# Patient Record
Sex: Male | Born: 1970 | Race: White | Hispanic: No | Marital: Married | Smoking: Current every day smoker
Health system: Southern US, Community
[De-identification: ages and names within clinical notes are randomized; demographics above are authoritative.]

## PROBLEM LIST (undated history)

## (undated) DIAGNOSIS — T1491XA Suicide attempt, initial encounter: Secondary | ICD-10-CM

## (undated) DIAGNOSIS — Z720 Tobacco use: Secondary | ICD-10-CM

## (undated) DIAGNOSIS — I493 Ventricular premature depolarization: Secondary | ICD-10-CM

## (undated) DIAGNOSIS — I10 Essential (primary) hypertension: Secondary | ICD-10-CM

## (undated) DIAGNOSIS — I499 Cardiac arrhythmia, unspecified: Secondary | ICD-10-CM

## (undated) DIAGNOSIS — I498 Other specified cardiac arrhythmias: Secondary | ICD-10-CM

## (undated) DIAGNOSIS — M549 Dorsalgia, unspecified: Secondary | ICD-10-CM

## (undated) DIAGNOSIS — I2510 Atherosclerotic heart disease of native coronary artery without angina pectoris: Secondary | ICD-10-CM

## (undated) HISTORY — DX: Essential (primary) hypertension: I10

## (undated) HISTORY — DX: Ventricular premature depolarization: I49.3

## (undated) HISTORY — DX: Suicide attempt, initial encounter: T14.91XA

---

## 1999-07-08 ENCOUNTER — Encounter: Payer: Self-pay | Admitting: Emergency Medicine

## 1999-07-08 ENCOUNTER — Emergency Department (HOSPITAL_COMMUNITY): Admission: EM | Admit: 1999-07-08 | Discharge: 1999-07-08 | Payer: Self-pay | Admitting: Emergency Medicine

## 1999-07-08 ENCOUNTER — Ambulatory Visit (HOSPITAL_BASED_OUTPATIENT_CLINIC_OR_DEPARTMENT_OTHER): Admission: RE | Admit: 1999-07-08 | Discharge: 1999-07-08 | Payer: Self-pay | Admitting: Orthopedic Surgery

## 2003-09-16 ENCOUNTER — Emergency Department (HOSPITAL_COMMUNITY): Admission: EM | Admit: 2003-09-16 | Discharge: 2003-09-17 | Payer: Self-pay | Admitting: Emergency Medicine

## 2003-09-25 ENCOUNTER — Ambulatory Visit (HOSPITAL_COMMUNITY): Admission: RE | Admit: 2003-09-25 | Discharge: 2003-09-25 | Payer: Self-pay | Admitting: Urology

## 2003-10-07 ENCOUNTER — Ambulatory Visit (HOSPITAL_COMMUNITY): Admission: RE | Admit: 2003-10-07 | Discharge: 2003-10-07 | Payer: Self-pay | Admitting: Urology

## 2003-10-07 ENCOUNTER — Ambulatory Visit (HOSPITAL_BASED_OUTPATIENT_CLINIC_OR_DEPARTMENT_OTHER): Admission: RE | Admit: 2003-10-07 | Discharge: 2003-10-07 | Payer: Self-pay | Admitting: Urology

## 2005-07-01 ENCOUNTER — Emergency Department (HOSPITAL_COMMUNITY): Admission: EM | Admit: 2005-07-01 | Discharge: 2005-07-01 | Payer: Self-pay | Admitting: Emergency Medicine

## 2005-10-23 IMAGING — CT CT PELVIS W/ CM
1 of 5 series · 12 of 32 positions shown, 18 images · IV contrast (omnipaque)
Comparison: none

CLINICAL DATA: Patient with hematuria.
 CT ABDOMEN WITHOUT CONTRAST
 Multiple spiral images were made through the abdomen with oral but not IV contrast.  The lung bases are normal.  There is no bone abnormality.  The liver, spleen, and pancreas are normal.  There are no renal calculi.  
 CT ABDOMEN WITH CONTRAST
 Repeat spiral images were made through the abdomen after intravenous injection of 100 cc Omnipaque 300.  Abdominal organs remain normal, as do the kidneys.  The renal collecting systems and ureters are normal. 
 IMPRESSION
 Normal CT scan of the abdomen without and with contrast.  No calculi or renal abnormality.
 CT PELVIS WITH CONTRAST
 Additional images through the pelvis after oral and intravenous contrast demonstrate no adenopathy or inflammatory change.  At the left base of the bladder, near the ureterovesicle junction, there is a 13 mm filling defect.  The ureter appears to be either part of this filling defect or immediately adjacent to it.  There is no additional abnormality. 
 13 mm filling defect within the left base of the bladder.  This either represents a noncontrast filled ureterocele or a bladder polyp.

[Series 3: abd/pel 5.0 b30f · axial · 0.62mm/px · z∈[-589,-239]mm · 12 of 84 slices shown, 18 images]
[im 7/84  soft-tissue]
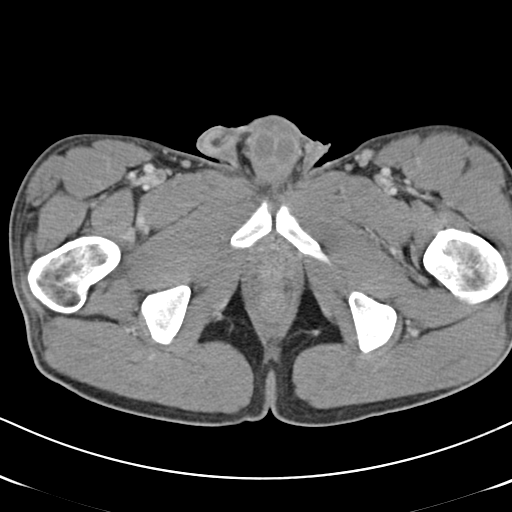
[im 7/84  bone]
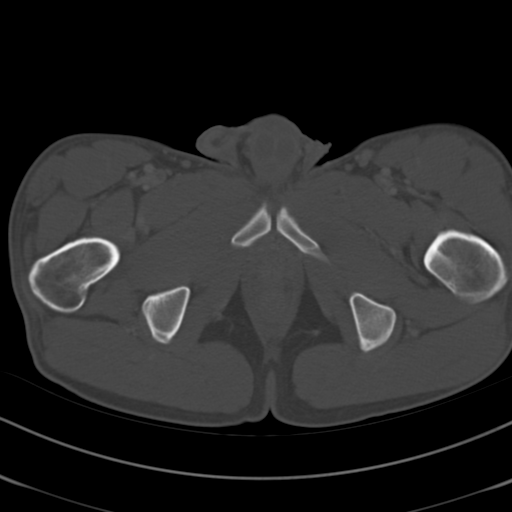
[im 13/84  soft-tissue]
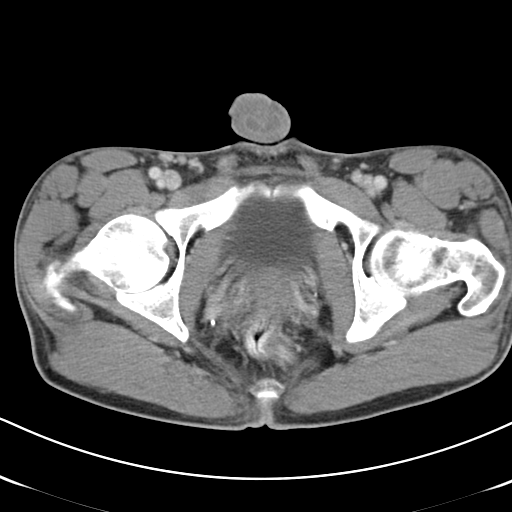
[im 20/84  soft-tissue]
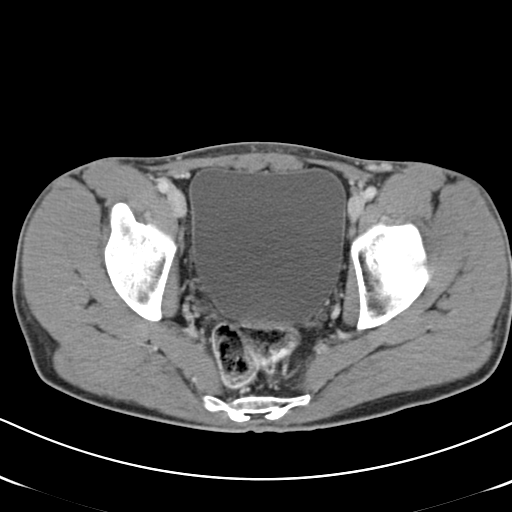
[im 26/84  soft-tissue]
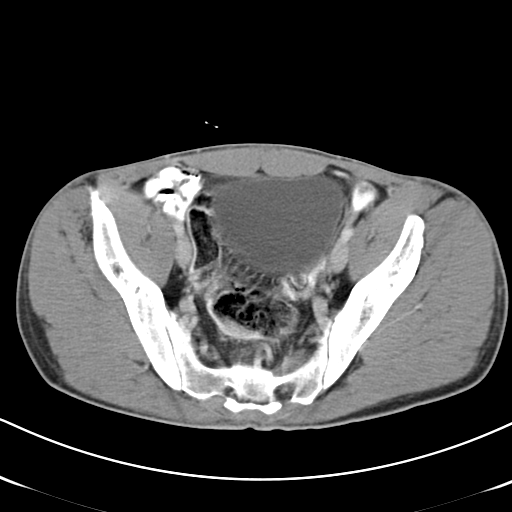
[im 32/84  soft-tissue]
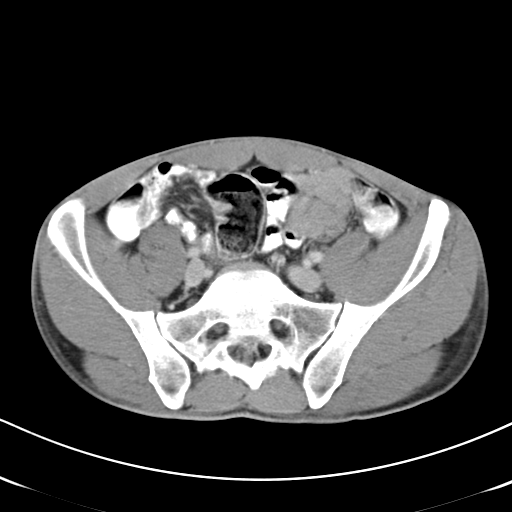
[im 39/84  soft-tissue]
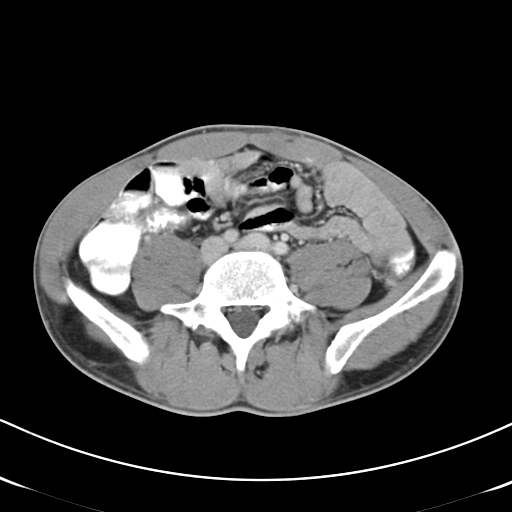
[im 45/84  soft-tissue]
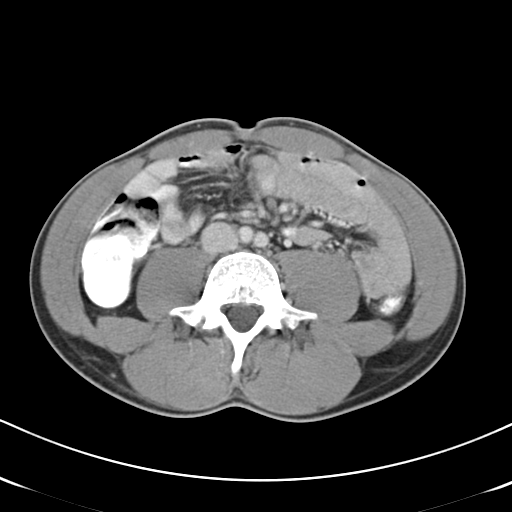
[im 52/84  soft-tissue]
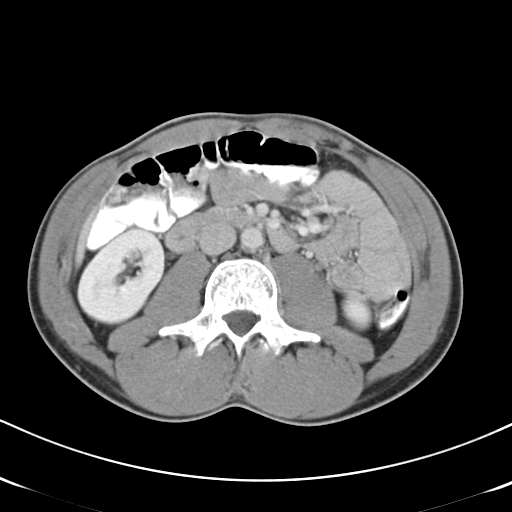
[im 58/84  soft-tissue]
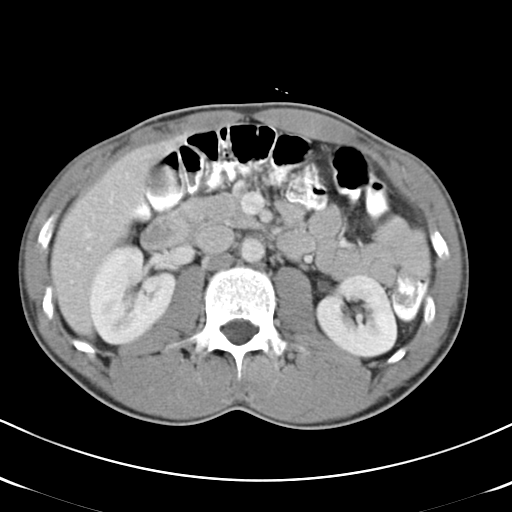
[im 58/84  lung]
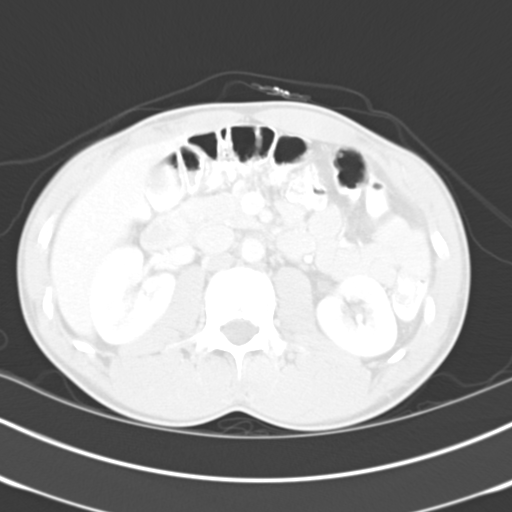
[im 58/84  bone]
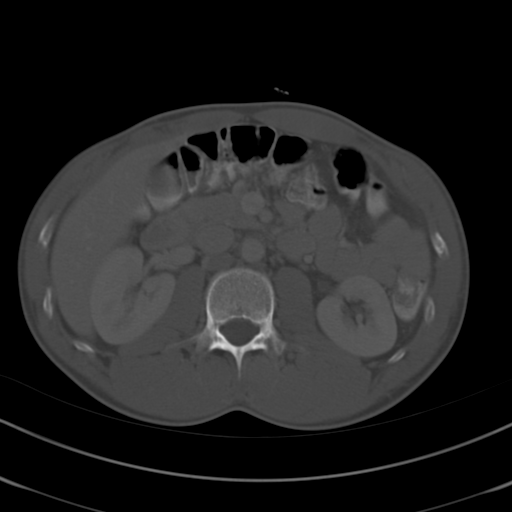
[im 64/84  soft-tissue]
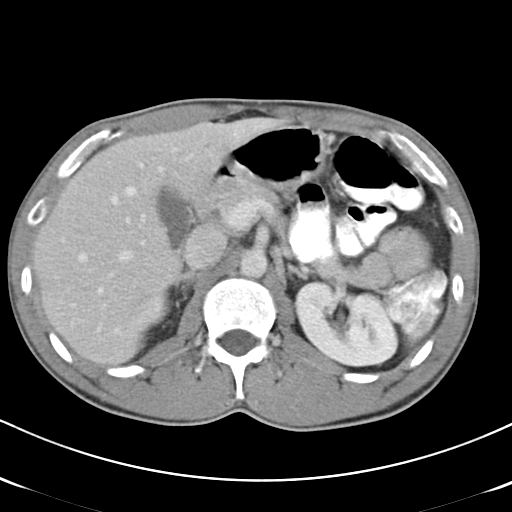
[im 64/84  lung]
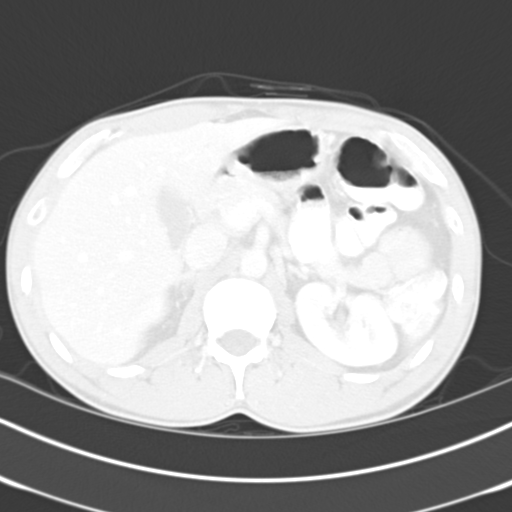
[im 71/84  soft-tissue]
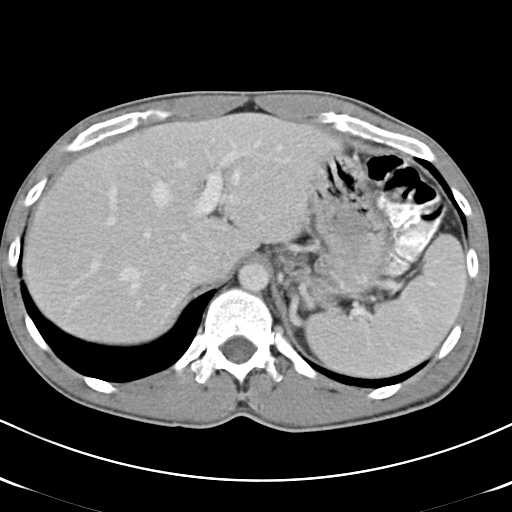
[im 71/84  lung]
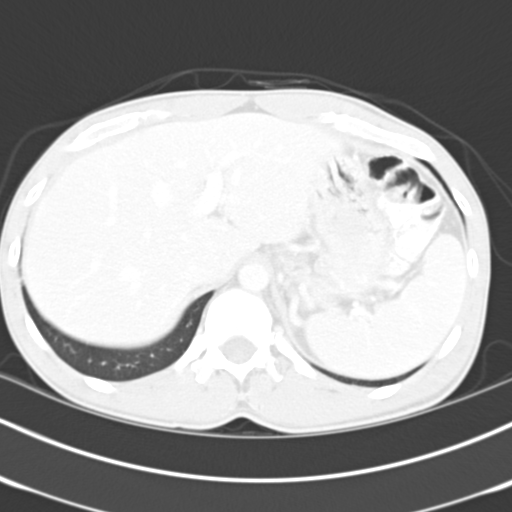
[im 77/84  soft-tissue]
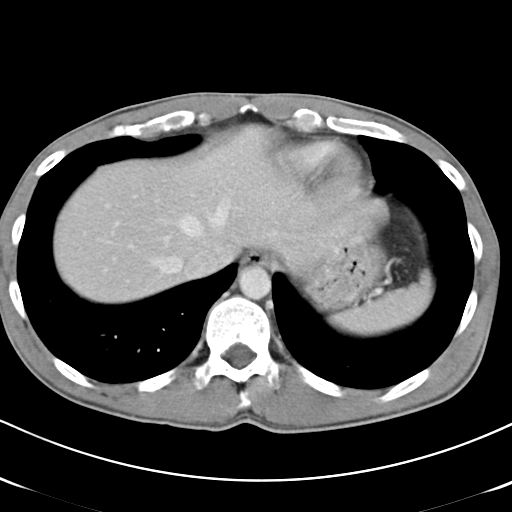
[im 77/84  lung]
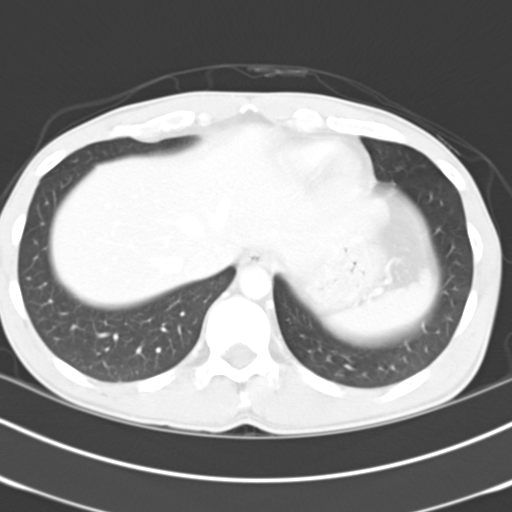

[12 of 32 positions shown; findings below may reference images not displayed]

## 2016-04-28 ENCOUNTER — Other Ambulatory Visit: Payer: Self-pay

## 2016-04-28 ENCOUNTER — Encounter (HOSPITAL_COMMUNITY): Payer: Self-pay | Admitting: Emergency Medicine

## 2016-04-28 ENCOUNTER — Inpatient Hospital Stay (HOSPITAL_COMMUNITY)
Admission: EM | Admit: 2016-04-28 | Discharge: 2016-04-30 | DRG: 247 | Disposition: A | Discharge status: Home / Self Care | Payer: Self-pay | Attending: Cardiovascular Disease | Admitting: Cardiovascular Disease

## 2016-04-28 ENCOUNTER — Emergency Department (HOSPITAL_COMMUNITY): Payer: Self-pay

## 2016-04-28 ENCOUNTER — Encounter (HOSPITAL_COMMUNITY)
Admission: EM | Disposition: A | Discharge status: Home / Self Care | Payer: Self-pay | Source: Home / Self Care | Attending: Cardiovascular Disease

## 2016-04-28 DIAGNOSIS — F17210 Nicotine dependence, cigarettes, uncomplicated: Secondary | ICD-10-CM | POA: Diagnosis present

## 2016-04-28 DIAGNOSIS — I472 Ventricular tachycardia: Secondary | ICD-10-CM | POA: Diagnosis present

## 2016-04-28 DIAGNOSIS — Z8249 Family history of ischemic heart disease and other diseases of the circulatory system: Secondary | ICD-10-CM

## 2016-04-28 DIAGNOSIS — I4729 Other ventricular tachycardia: Secondary | ICD-10-CM | POA: Insufficient documentation

## 2016-04-28 DIAGNOSIS — I214 Non-ST elevation (NSTEMI) myocardial infarction: Principal | ICD-10-CM | POA: Diagnosis present

## 2016-04-28 DIAGNOSIS — I2121 ST elevation (STEMI) myocardial infarction involving left circumflex coronary artery: Secondary | ICD-10-CM

## 2016-04-28 DIAGNOSIS — Z955 Presence of coronary angioplasty implant and graft: Secondary | ICD-10-CM

## 2016-04-28 DIAGNOSIS — I2510 Atherosclerotic heart disease of native coronary artery without angina pectoris: Secondary | ICD-10-CM

## 2016-04-28 DIAGNOSIS — I499 Cardiac arrhythmia, unspecified: Secondary | ICD-10-CM

## 2016-04-28 DIAGNOSIS — Z720 Tobacco use: Secondary | ICD-10-CM

## 2016-04-28 DIAGNOSIS — E785 Hyperlipidemia, unspecified: Secondary | ICD-10-CM | POA: Diagnosis present

## 2016-04-28 DIAGNOSIS — R079 Chest pain, unspecified: Secondary | ICD-10-CM

## 2016-04-28 DIAGNOSIS — I209 Angina pectoris, unspecified: Secondary | ICD-10-CM

## 2016-04-28 HISTORY — DX: Other specified cardiac arrhythmias: I49.8

## 2016-04-28 HISTORY — DX: Cardiac arrhythmia, unspecified: I49.9

## 2016-04-28 HISTORY — PX: CARDIAC CATHETERIZATION: SHX172

## 2016-04-28 HISTORY — DX: Tobacco use: Z72.0

## 2016-04-28 HISTORY — DX: Atherosclerotic heart disease of native coronary artery without angina pectoris: I25.10

## 2016-04-28 LAB — DIFFERENTIAL
Basophils Absolute: 0 10*3/uL (ref 0.0–0.1)
Basophils Relative: 0 %
EOS PCT: 2 %
Eosinophils Absolute: 0.4 10*3/uL (ref 0.0–0.7)
LYMPHS ABS: 1.1 10*3/uL (ref 0.7–4.0)
LYMPHS PCT: 7 %
MONO ABS: 1.3 10*3/uL — AB (ref 0.1–1.0)
Monocytes Relative: 8 %
NEUTROS ABS: 14 10*3/uL — AB (ref 1.7–7.7)
NEUTROS PCT: 83 %

## 2016-04-28 LAB — LIPID PANEL
CHOL/HDL RATIO: 5.1 ratio
CHOLESTEROL: 122 mg/dL (ref 0–200)
HDL: 24 mg/dL — AB (ref >40)
LDL Cholesterol: 74 mg/dL (ref 0–99)
Triglycerides: 119 mg/dL (ref <150)
VLDL: 24 mg/dL (ref 0–40)

## 2016-04-28 LAB — CREATININE, SERUM
Creatinine, Ser: 1.16 mg/dL (ref 0.61–1.24)
GFR calc non Af Amer: >60 mL/min (ref >60)

## 2016-04-28 LAB — BASIC METABOLIC PANEL
Anion gap: 8 (ref 5–15)
BUN: 7 mg/dL (ref 6–20)
CO2: 24 mmol/L (ref 22–32)
CREATININE: 1.21 mg/dL (ref 0.61–1.24)
Calcium: 9.3 mg/dL (ref 8.9–10.3)
Chloride: 106 mmol/L (ref 101–111)
GFR calc Af Amer: >60 mL/min (ref >60)
Glucose, Bld: 123 mg/dL — ABNORMAL HIGH (ref 65–99)
Potassium: 3.9 mmol/L (ref 3.5–5.1)
SODIUM: 138 mmol/L (ref 135–145)

## 2016-04-28 LAB — CBC
HCT: 48.7 % (ref 39.0–52.0)
HEMATOCRIT: 45.3 % (ref 39.0–52.0)
Hemoglobin: 17.2 g/dL — ABNORMAL HIGH (ref 13.0–17.0)
Hemoglobin: 15.6 g/dL (ref 13.0–17.0)
MCH: 34.3 pg — ABNORMAL HIGH (ref 26.0–34.0)
MCH: 33.5 pg (ref 26.0–34.0)
MCHC: 35.3 g/dL (ref 30.0–36.0)
MCHC: 34.4 g/dL (ref 30.0–36.0)
MCV: 97 fL (ref 78.0–100.0)
MCV: 97.2 fL (ref 78.0–100.0)
PLATELETS: 234 10*3/uL (ref 150–400)
Platelets: 209 10*3/uL (ref 150–400)
RBC: 5.02 MIL/uL (ref 4.22–5.81)
RBC: 4.66 MIL/uL (ref 4.22–5.81)
RDW: 12.9 % (ref 11.5–15.5)
RDW: 12.8 % (ref 11.5–15.5)
WBC: 16.4 10*3/uL — AB (ref 4.0–10.5)
WBC: 16.9 10*3/uL — ABNORMAL HIGH (ref 4.0–10.5)

## 2016-04-28 LAB — PROTIME-INR
INR: 2.09 — ABNORMAL HIGH (ref 0.00–1.49)
Prothrombin Time: 23.3 seconds — ABNORMAL HIGH (ref 11.6–15.2)

## 2016-04-28 LAB — APTT: APTT: 153 s — AB (ref 24–37)

## 2016-04-28 LAB — CK TOTAL AND CKMB (NOT AT ARMC)
CK TOTAL: 5172 U/L — AB (ref 49–397)
CK, MB: >260 ng/mL — ABNORMAL HIGH (ref 0.5–5.0)

## 2016-04-28 LAB — MRSA PCR SCREENING: MRSA BY PCR: NEGATIVE

## 2016-04-28 LAB — I-STAT TROPONIN, ED: Troponin i, poc: 0.66 ng/mL (ref 0.00–0.08)

## 2016-04-28 LAB — POCT ACTIVATED CLOTTING TIME: ACTIVATED CLOTTING TIME: 907 s

## 2016-04-28 SURGERY — LEFT HEART CATH AND CORONARY ANGIOGRAPHY

## 2016-04-28 MED ORDER — IOPAMIDOL (ISOVUE-370) INJECTION 76%
INTRAVENOUS | Status: DC | PRN
  Administered 2016-04-28: 115 mL via INTRA_ARTERIAL

## 2016-04-28 MED ORDER — VERAPAMIL HCL 2.5 MG/ML IV SOLN
INTRAVENOUS | Status: DC | PRN
  Administered 2016-04-28: 8 mL via INTRA_ARTERIAL

## 2016-04-28 MED ORDER — LIDOCAINE HCL (PF) 1 % IJ SOLN
INTRAMUSCULAR | Status: AC
  Filled 2016-04-28: qty 30

## 2016-04-28 MED ORDER — FENTANYL CITRATE (PF) 100 MCG/2ML IJ SOLN
INTRAMUSCULAR | Status: DC | PRN
  Administered 2016-04-28: 50 ug via INTRAVENOUS

## 2016-04-28 MED ORDER — HEPARIN (PORCINE) IN NACL 2-0.9 UNIT/ML-% IJ SOLN
INTRAMUSCULAR | Status: DC | PRN
  Administered 2016-04-28: 1000 mL

## 2016-04-28 MED ORDER — SODIUM CHLORIDE 0.9 % IV SOLN
0.1500 mg/kg/h | INTRAVENOUS | Status: DC
  Filled 2016-04-28: qty 250

## 2016-04-28 MED ORDER — SODIUM CHLORIDE 0.9% FLUSH
3.0000 mL | Freq: Two times a day (BID) | INTRAVENOUS | Status: DC
  Administered 2016-04-29 – 2016-04-30 (×3): 3 mL via INTRAVENOUS

## 2016-04-28 MED ORDER — FENTANYL CITRATE (PF) 100 MCG/2ML IJ SOLN
INTRAMUSCULAR | Status: AC
  Filled 2016-04-28: qty 2

## 2016-04-28 MED ORDER — HEPARIN BOLUS VIA INFUSION: 4000.0000 [IU] | Freq: Once | INTRAVENOUS | Status: DC

## 2016-04-28 MED ORDER — TICAGRELOR 90 MG PO TABS
ORAL_TABLET | ORAL | Status: AC
  Filled 2016-04-28: qty 1

## 2016-04-28 MED ORDER — BIVALIRUDIN 250 MG IV SOLR
INTRAVENOUS | Status: AC
  Filled 2016-04-28: qty 250

## 2016-04-28 MED ORDER — NITROGLYCERIN 0.4 MG SL SUBL
0.4000 mg | SUBLINGUAL_TABLET | SUBLINGUAL | Status: DC | PRN
  Administered 2016-04-28: 0.4 mg via SUBLINGUAL
  Filled 2016-04-28: qty 1

## 2016-04-28 MED ORDER — SODIUM CHLORIDE 0.9% FLUSH: 3.0000 mL | INTRAVENOUS | Status: DC | PRN

## 2016-04-28 MED ORDER — ONDANSETRON HCL 4 MG/2ML IJ SOLN: 4.0000 mg | Freq: Four times a day (QID) | INTRAMUSCULAR | Status: DC | PRN

## 2016-04-28 MED ORDER — METOPROLOL TARTRATE 12.5 MG HALF TABLET
12.5000 mg | ORAL_TABLET | Freq: Two times a day (BID) | ORAL | Status: DC
  Administered 2016-04-28 – 2016-04-30 (×4): 12.5 mg via ORAL
  Filled 2016-04-28 (×4): qty 1

## 2016-04-28 MED ORDER — BIVALIRUDIN BOLUS VIA INFUSION - CUPID
INTRAVENOUS | Status: DC | PRN
  Administered 2016-04-28: 56.25 mg via INTRAVENOUS

## 2016-04-28 MED ORDER — SODIUM CHLORIDE 0.9 % IV SOLN: 10.0000 mL/h | INTRAVENOUS | Status: DC

## 2016-04-28 MED ORDER — MIDAZOLAM HCL 2 MG/2ML IJ SOLN
INTRAMUSCULAR | Status: DC | PRN
  Administered 2016-04-28: 2 mg via INTRAVENOUS

## 2016-04-28 MED ORDER — AMIODARONE HCL IN DEXTROSE 360-4.14 MG/200ML-% IV SOLN
30.0000 mg/h | INTRAVENOUS | Status: DC
  Administered 2016-04-29: 30 mg/h via INTRAVENOUS

## 2016-04-28 MED ORDER — PNEUMOCOCCAL VAC POLYVALENT 25 MCG/0.5ML IJ INJ
0.5000 mL | INJECTION | INTRAMUSCULAR | Status: AC
  Administered 2016-04-30: 0.5 mL via INTRAMUSCULAR
  Filled 2016-04-28: qty 0.5

## 2016-04-28 MED ORDER — VERAPAMIL HCL 2.5 MG/ML IV SOLN
INTRAVENOUS | Status: AC
  Filled 2016-04-28: qty 2

## 2016-04-28 MED ORDER — ATORVASTATIN CALCIUM 80 MG PO TABS
80.0000 mg | ORAL_TABLET | Freq: Every day | ORAL | Status: DC
  Administered 2016-04-28 – 2016-04-29 (×2): 80 mg via ORAL
  Filled 2016-04-28 (×2): qty 1

## 2016-04-28 MED ORDER — NITROGLYCERIN 0.4 MG SL SUBL: 0.4000 mg | SUBLINGUAL_TABLET | SUBLINGUAL | Status: DC | PRN

## 2016-04-28 MED ORDER — ASPIRIN EC 81 MG PO TBEC
81.0000 mg | DELAYED_RELEASE_TABLET | Freq: Every day | ORAL | Status: DC
  Administered 2016-04-29 – 2016-04-30 (×2): 81 mg via ORAL
  Filled 2016-04-28 (×2): qty 1

## 2016-04-28 MED ORDER — AMIODARONE HCL IN DEXTROSE 360-4.14 MG/200ML-% IV SOLN
60.0000 mg/h | INTRAVENOUS | Status: AC
  Filled 2016-04-28: qty 200

## 2016-04-28 MED ORDER — HEPARIN SODIUM (PORCINE) 5000 UNIT/ML IJ SOLN: 60.0000 [IU]/kg | INTRAMUSCULAR | Status: DC

## 2016-04-28 MED ORDER — MIDAZOLAM HCL 2 MG/2ML IJ SOLN
INTRAMUSCULAR | Status: AC
  Filled 2016-04-28: qty 2

## 2016-04-28 MED ORDER — SODIUM CHLORIDE 0.9 % IV SOLN
0.2500 mg/kg/h | INTRAVENOUS | Status: AC
  Filled 2016-04-28: qty 250

## 2016-04-28 MED ORDER — AMIODARONE HCL 150 MG/3ML IV SOLN
INTRAVENOUS | Status: AC
  Filled 2016-04-28: qty 3

## 2016-04-28 MED ORDER — SODIUM CHLORIDE 0.9 % IV SOLN: 250.0000 mL | INTRAVENOUS | Status: DC | PRN

## 2016-04-28 MED ORDER — HEPARIN (PORCINE) IN NACL 2-0.9 UNIT/ML-% IJ SOLN
INTRAMUSCULAR | Status: AC
  Filled 2016-04-28: qty 1000

## 2016-04-28 MED ORDER — AMIODARONE HCL IN DEXTROSE 360-4.14 MG/200ML-% IV SOLN
INTRAVENOUS | Status: AC
  Filled 2016-04-28: qty 200

## 2016-04-28 MED ORDER — ASPIRIN 81 MG PO CHEW
324.0000 mg | CHEWABLE_TABLET | Freq: Once | ORAL | Status: AC
  Administered 2016-04-28: 324 mg via ORAL
  Filled 2016-04-28: qty 4

## 2016-04-28 MED ORDER — AMIODARONE HCL IN DEXTROSE 360-4.14 MG/200ML-% IV SOLN
INTRAVENOUS | Status: DC | PRN
  Administered 2016-04-28: 60 mg/h via INTRAVENOUS

## 2016-04-28 MED ORDER — LIDOCAINE HCL (PF) 1 % IJ SOLN
INTRAMUSCULAR | Status: DC | PRN
  Administered 2016-04-28: 2 mL

## 2016-04-28 MED ORDER — TICAGRELOR 90 MG PO TABS
ORAL_TABLET | ORAL | Status: DC | PRN
  Administered 2016-04-28: 180 mg via ORAL

## 2016-04-28 MED ORDER — ASPIRIN 81 MG PO CHEW: 324.0000 mg | CHEWABLE_TABLET | ORAL | Status: DC

## 2016-04-28 MED ORDER — HEPARIN SODIUM (PORCINE) 5000 UNIT/ML IJ SOLN
4000.0000 [IU] | Freq: Once | INTRAMUSCULAR | Status: AC
Start: 2016-04-28 — End: 2016-04-28
  Administered 2016-04-28: 4000 [IU] via INTRAVENOUS

## 2016-04-28 MED ORDER — HEPARIN SODIUM (PORCINE) 5000 UNIT/ML IJ SOLN: 5000.0000 [IU] | Freq: Three times a day (TID) | INTRAMUSCULAR | Status: DC

## 2016-04-28 MED ORDER — AMIODARONE HCL 150 MG/3ML IV SOLN
INTRAVENOUS | Status: DC | PRN
  Administered 2016-04-28: 150 mg via INTRAVENOUS

## 2016-04-28 MED ORDER — ATORVASTATIN CALCIUM 80 MG PO TABS: 80.0000 mg | ORAL_TABLET | Freq: Every day | ORAL | Status: DC

## 2016-04-28 MED ORDER — ACETAMINOPHEN 325 MG PO TABS: 650.0000 mg | ORAL_TABLET | ORAL | Status: DC | PRN

## 2016-04-28 MED ORDER — ASPIRIN 300 MG RE SUPP: 300.0000 mg | RECTAL | Status: DC

## 2016-04-28 MED ORDER — IOPAMIDOL (ISOVUE-370) INJECTION 76%
INTRAVENOUS | Status: AC
  Filled 2016-04-28: qty 125

## 2016-04-28 MED ORDER — HEPARIN SODIUM (PORCINE) 5000 UNIT/ML IJ SOLN: 60.0000 [IU]/kg | Freq: Once | INTRAMUSCULAR | Status: DC

## 2016-04-28 MED ORDER — SODIUM CHLORIDE 0.9 % IV SOLN: INTRAVENOUS | Status: AC

## 2016-04-28 MED ORDER — ASPIRIN EC 81 MG PO TBEC: 81.0000 mg | DELAYED_RELEASE_TABLET | Freq: Every day | ORAL | Status: DC

## 2016-04-28 MED ORDER — TICAGRELOR 90 MG PO TABS
90.0000 mg | ORAL_TABLET | Freq: Two times a day (BID) | ORAL | Status: DC
  Administered 2016-04-29 – 2016-04-30 (×3): 90 mg via ORAL
  Filled 2016-04-28 (×3): qty 1

## 2016-04-28 MED ORDER — SODIUM CHLORIDE 0.9 % IV SOLN
250.0000 mg | INTRAVENOUS | Status: DC | PRN
  Administered 2016-04-28: 1.75 mg/kg/h via INTRAVENOUS

## 2016-04-28 SURGICAL SUPPLY — 18 items
BALLN EUPHORA RX 2.5X12 (BALLOONS) ×3
BALLN ~~LOC~~ EMERGE MR 3.25X12 (BALLOONS) ×3
BALLOON EUPHORA RX 2.5X12 (BALLOONS) IMPLANT
BALLOON ~~LOC~~ EMERGE MR 3.25X12 (BALLOONS) IMPLANT
CATH INFINITI 5FR ANG PIGTAIL (CATHETERS) ×2 IMPLANT
CATH INFINITI JR4 5F (CATHETERS) ×2 IMPLANT
CATH VISTA GUIDE 6FR XBLAD3.5 (CATHETERS) ×2 IMPLANT
DEVICE RAD COMP TR BAND LRG (VASCULAR PRODUCTS) ×2 IMPLANT
GLIDESHEATH SLEND SS 6F .021 (SHEATH) ×2 IMPLANT
KIT ENCORE 26 ADVANTAGE (KITS) ×2 IMPLANT
KIT HEART LEFT (KITS) ×3 IMPLANT
PACK CARDIAC CATHETERIZATION (CUSTOM PROCEDURE TRAY) ×3 IMPLANT
STENT PROMUS PREM MR 3.0X16 (Permanent Stent) ×2 IMPLANT
SYR MEDRAD MARK V 150ML (SYRINGE) ×3 IMPLANT
TRANSDUCER W/STOPCOCK (MISCELLANEOUS) ×3 IMPLANT
TUBING CIL FLEX 10 FLL-RA (TUBING) ×3 IMPLANT
WIRE COUGAR XT STRL 190CM (WIRE) ×2 IMPLANT
WIRE SAFE-T 1.5MM-J .035X260CM (WIRE) ×2 IMPLANT

## 2016-04-28 NOTE — ED Notes (Signed)
Melonie FloridaFiancee' Kimberly  [phone]

## 2016-04-28 NOTE — ED Notes (Signed)
Pt arrives to room, hooked up to monitor immediately and has an episode of what appears to be v-tach. MD called to bedside immediately. 2 IV's placed.

## 2016-04-28 NOTE — ED Notes (Signed)
Pt states at 1pm after eating lunch he had a sudden onset of sharp central chest pain and became diaphoretic. Pt states pain is 10/10 in middle of chest.

## 2016-04-28 NOTE — H&P (Addendum)
Caleb Grant is an 45 y.o. male.   Primary Cardiologist: newAngelena Form PMD:  Chief Complaint: chest pain HPI: 45 year old man with a family history of premature coronary artery disease and a long history of tobacco abuse. He began having chest pressure starting at 1:30 this afternoon. It was constant. He decided to come to the emergency room due to persistent symptoms. While in the emergency room, he had an episode of a rapid, wide complex tachycardia. While in the tachycardia, his chest discomfort increased significantly. It spontaneously broke to normal sinus rhythm in which there were no clear ST segment changes. His chest discomfort persisted. His troponin was positive. We are therefore asked to evaluate urgently.  He continues to have 9 out of 10 chest discomfort. He denies any bleeding problems. Further history reveals that he did have a heart cath about 5 years ago at [facility] which per his report was clean. He continues to smoke. He works delivering cars. He is actually scheduled to drive to [place] tomorrow.  Past Medical History  Diagnosis Date  . Tobacco abuse     History reviewed. No pertinent past surgical history.  Family History  Problem Relation Age of Onset  . Coronary artery disease Father   . Coronary artery disease Paternal Grandfather    Social History:  reports that he has been smoking Cigarettes.  He has been smoking about 2.00 packs per day. He does not have any smokeless tobacco history on file. He reports that he does not drink alcohol. His drug history is not on file.  Allergies: No Known Allergies  No prescriptions prior to admission    Results for orders placed or performed during the hospital encounter of 04/28/16 (from the past 48 hour(s))  Basic metabolic panel     Status: Abnormal   Collection Time: 04/28/16  4:52 PM  Result Value Ref Range   Sodium 138 135 - 145 mmol/L   Potassium 3.9 3.5 - 5.1 mmol/L   Chloride 106 101 - 111 mmol/L   CO2 24 22 - 32 mmol/L   Glucose, Bld 123 (H) 65 - 99 mg/dL   BUN 7 6 - 20 mg/dL   Creatinine, Ser 1.21 0.61 - 1.24 mg/dL   Calcium 9.3 8.9 - 10.3 mg/dL   GFR calc non Af Amer >60 >60 mL/min   GFR calc Af Amer >60 >60 mL/min    Comment: (NOTE) The eGFR has been calculated using the CKD EPI equation. This calculation has not been validated in all clinical situations. eGFR's persistently <60 mL/min signify possible Chronic Kidney Disease.    Anion gap 8 5 - 15  CBC     Status: Abnormal   Collection Time: 04/28/16  4:52 PM  Result Value Ref Range   WBC 16.4 (H) 4.0 - 10.5 K/uL   RBC 5.02 4.22 - 5.81 MIL/uL   Hemoglobin 17.2 (H) 13.0 - 17.0 g/dL   HCT 48.7 39.0 - 52.0 %   MCV 97.0 78.0 - 100.0 fL   MCH 34.3 (H) 26.0 - 34.0 pg   MCHC 35.3 30.0 - 36.0 g/dL   RDW 12.9 11.5 - 15.5 %   Platelets 234 150 - 400 K/uL  I-stat troponin, ED     Status: Abnormal   Collection Time: 04/28/16  5:01 PM  Result Value Ref Range   Troponin i, poc 0.66 (HH) 0.00 - 0.08 ng/mL   Comment NOTIFIED PHYSICIAN    Comment 3  Comment: Due to the release kinetics of cTnI, a negative result within the first hours of the onset of symptoms does not rule out myocardial infarction with certainty. If myocardial infarction is still suspected, repeat the test at appropriate intervals.    Dg Chest Port 1 View  04/28/2016  CLINICAL DATA:  Severe center chest pain x4 hours. Hx of HTN. Smoker. EXAM: PORTABLE CHEST - 1 VIEW COMPARISON:  12/16/2009 FINDINGS: Somewhat prominent perihilar and bibasilar interstitial markings, similar to previous exam. No confluent airspace infiltrate. No overt interstitial edema. Heart size and mediastinal contours are within normal limits. No effusion. Visualized bones unremarkable. IMPRESSION: 1. Chronic perihilar and bibasilar interstitial prominence. No acute disease. Electronically Signed   By: Lucrezia Europe M.D.   On: 04/28/2016 17:48    ROS: Significant for chest discomfort,  worse when he is in a tachycardia. No bleeding problems. He has had hemorrhoids but no internal bleeding. Unable to quit smoking. All the systems negative.  OBJECTIVE:   Vitals:   Filed Vitals:   04/28/16 1708 04/28/16 1730 04/28/16 1755  BP: 169/111 139/93 119/80  Pulse: 80 90   Resp: 16 15   SpO2: 100% 99%    I&O's:  No intake or output data in the 24 hours ending 04/28/16 1813 TELEMETRY: Reviewed telemetry pt in : normal sinus rhythm with intermittent tachycardia    PHYSICAL EXAM General: Well developed, well nourished, in no acute distress Head:   Normal cephalic and atramatic  Lungs:   Clear bilaterally to auscultation. Heart:   HRRR S1 S2  No JVD.   Abdomen: abdomen soft and non-tender Msk:  Back normal,  Normal strength and tone for age. Extremities:   No edema.   2+ right radial pulse Neuro: Alert and oriented. Psych:  Normal affect, responds appropriately  LABS: Basic Metabolic Panel:  Recent Labs  04/28/16 1652  NA 138  K 3.9  CL 106  CO2 24  GLUCOSE 123*  BUN 7  CREATININE 1.21  CALCIUM 9.3   Liver Function Tests: No results for input(s): AST, ALT, ALKPHOS, BILITOT, PROT, ALBUMIN in the last 72 hours. No results for input(s): LIPASE, AMYLASE in the last 72 hours. CBC:  Recent Labs  04/28/16 1652  WBC 16.4*  HGB 17.2*  HCT 48.7  MCV 97.0  PLT 234   Cardiac Enzymes: No results for input(s): CKTOTAL, CKMB, CKMBINDEX, TROPONINI in the last 72 hours. BNP: Invalid input(s): POCBNP D-Dimer: No results for input(s): DDIMER in the last 72 hours. Hemoglobin A1C: No results for input(s): HGBA1C in the last 72 hours. Fasting Lipid Panel: No results for input(s): CHOL, HDL, LDLCALC, TRIG, CHOLHDL, LDLDIRECT in the last 72 hours. Thyroid Function Tests: No results for input(s): TSH, T4TOTAL, T3FREE, THYROIDAB in the last 72 hours.  Invalid input(s): FREET3 Anemia Panel: No results for input(s): VITAMINB12, FOLATE, FERRITIN, TIBC, IRON, RETICCTPCT  in the last 72 hours. Coag Panel:   No results found for: INR, PROTIME     Assessment/Plan Acute coronary syndrome: Elevated troponin with evidence of ventricular tachycardia on telemetry. Persistent pain.  Plan for emergent cardiac catheterization with Dr. Julianne Handler.  Patient should be able to tolerate a drug-eluting stent.  Long-term, he will need a statin, beta blocker and he'll need to stop smoking.  Procedure was explained to the patient and fiancee.  Larae Grooms 04/28/2016, 6:13 PM   Critical care time 40 minutes

## 2016-04-28 NOTE — ED Provider Notes (Signed)
CSN: [id number]     Arrival date & time 04/28/16  1645 History   First MD Initiated Contact with Patient 04/28/16 1725     Chief Complaint  Patient presents with  . Chest Pain   HPI Patient states he developed severe sharp chest pain and became diaphoretic while eating lunch today. Pain is 12 out of 10. Feels sharp and radiates to the right arm. No history of MI. No dyslipidemia hypertension. Denies cocaine use. Did not pass out does not feel lightheaded. No vomiting no abdominal pain no tearing back pain. Does feel some shortness of breath. Patient has not taken anything for his symptoms. Symptoms are severe. Pt has had in past a cath 5 years ago related to CP, states it was negative for CAD.   Past Medical History  Diagnosis Date  . Tobacco abuse    History reviewed. No pertinent past surgical history. Family History  Problem Relation Age of Onset  . Coronary artery disease Father   . Coronary artery disease Paternal Grandfather    Social History  Substance Use Topics  . Smoking status: Current Every Day Smoker -- 2.00 packs/day    Types: Cigarettes  . Smokeless tobacco: None  . Alcohol Use: No    Review of Systems  Constitutional: Negative for fever.  Respiratory: Negative for cough.   Cardiovascular: Positive for palpitations.  Allergic/Immunologic: Negative for immunocompromised state.  All other systems reviewed and are negative.     Allergies  Review of patient's allergies indicates no known allergies.  Home Medications   Prior to Admission medications   Medication Sig Start Date End Date Taking? Authorizing Provider  carvedilol (COREG) 6.25 MG tablet Take 6.25 mg by mouth 2 (two) times daily with a meal.   Yes Historical Provider, MD   BP 169/111 mmHg  Pulse 80  Resp 16  SpO2 100% Physical Exam  Constitutional: He is oriented to person, place, and time. He appears well-developed and well-nourished. He appears distressed.  HENT:  Head: Normocephalic and  atraumatic.  Eyes: Conjunctivae are normal. Right eye exhibits no discharge. Left eye exhibits no discharge.  Neck: Normal range of motion. No JVD present.  Cardiovascular: Normal rate, regular rhythm and normal heart sounds.   No murmur heard. Pulmonary/Chest: Breath sounds normal. No respiratory distress. He has no wheezes. He has no rales. He exhibits no tenderness.  Abdominal: Soft. Bowel sounds are normal. He exhibits no distension.  Musculoskeletal: Normal range of motion. He exhibits no edema (of LEs).  Neurological: He is alert and oriented to person, place, and time.  Skin: Skin is warm. He is not diaphoretic.  Nursing note and vitals reviewed.   ED Course  Procedures (including critical care time) Labs Review Labs Reviewed  BASIC METABOLIC PANEL - Abnormal; Notable for the following:    Glucose, Bld 123 (*)    All other components within normal limits  CBC - Abnormal; Notable for the following:    WBC 16.4 (*)    Hemoglobin 17.2 (*)    MCH 34.3 (*)    All other components within normal limits  PROTIME-INR - Abnormal; Notable for the following:    Prothrombin Time 23.3 (*)    INR 2.09 (*)    All other components within normal limits  DIFFERENTIAL - Abnormal; Notable for the following:    Neutro Abs 14.0 (*)    Monocytes Absolute 1.3 (*)    All other components within normal limits  APTT - Abnormal; Notable for the following:  aPTT 153 (*)    All other components within normal limits  LIPID PANEL - Abnormal; Notable for the following:    HDL 24 (*)    All other components within normal limits  CBC - Abnormal; Notable for the following:    WBC 16.9 (*)    All other components within normal limits  CK TOTAL AND CKMB (NOT AT [facility]) - Abnormal; Notable for the following:    Total CK 5172 (*)    CK, MB >260.0 (*)    All other components within normal limits  I-STAT TROPOININ, ED - Abnormal; Notable for the following:    Troponin i, poc 0.66 (*)    All other  components within normal limits  MRSA PCR SCREENING  CREATININE, SERUM  BASIC METABOLIC PANEL  HEMOGLOBIN A1C  LIPID PANEL  CK TOTAL AND CKMB (NOT AT [facility])  CK TOTAL AND CKMB (NOT AT [facility])  TROPONIN I  TROPONIN I  BASIC METABOLIC PANEL  CBC  LIPID PANEL  POCT ACTIVATED CLOTTING TIME    Imaging Review Dg Chest Port 1 View  04/28/2016  CLINICAL DATA:  Severe center chest pain x4 hours. Hx of HTN. Smoker. EXAM: PORTABLE CHEST - 1 VIEW COMPARISON:  12/16/2009 FINDINGS: Somewhat prominent perihilar and bibasilar interstitial markings, similar to previous exam. No confluent airspace infiltrate. No overt interstitial edema. Heart size and mediastinal contours are within normal limits. No effusion. Visualized bones unremarkable. IMPRESSION: 1. Chronic perihilar and bibasilar interstitial prominence. No acute disease. Electronically Signed   By: Corlis Leak M.D.   On: 04/28/2016 17:48   I have personally reviewed and evaluated these images and lab results as part of my medical decision-making.   EKG Interpretation None      MDM   Final diagnoses:  Cardiac arrhythmia, unspecified cardiac arrhythmia type    Concern for ACS with troponin elevation and EKG consistent with ventricular tachycardia. Immediately spoke with cardiology who is also concerned for ACS and will emergently taken to the cath lab. Heparin bolus, aspirin, nitroglycerin administered. Patient continues hemodynamically stable and now in sinus rhythm. No amiodarone will be given.    Sidney Ace, MD 04/28/16 9147  Azalia Bilis, MD 04/29/16 [phone]

## 2016-04-29 ENCOUNTER — Inpatient Hospital Stay (HOSPITAL_COMMUNITY): Payer: MEDICAID

## 2016-04-29 ENCOUNTER — Encounter (HOSPITAL_COMMUNITY): Payer: Self-pay | Admitting: Cardiovascular Disease

## 2016-04-29 DIAGNOSIS — I2129 ST elevation (STEMI) myocardial infarction involving other sites: Secondary | ICD-10-CM

## 2016-04-29 DIAGNOSIS — I2510 Atherosclerotic heart disease of native coronary artery without angina pectoris: Secondary | ICD-10-CM

## 2016-04-29 DIAGNOSIS — F17200 Nicotine dependence, unspecified, uncomplicated: Secondary | ICD-10-CM

## 2016-04-29 LAB — LIPID PANEL
CHOL/HDL RATIO: 5.6 ratio
CHOLESTEROL: 117 mg/dL (ref 0–200)
HDL: 21 mg/dL — ABNORMAL LOW (ref >40)
LDL Cholesterol: 70 mg/dL (ref 0–99)
TRIGLYCERIDES: 131 mg/dL (ref <150)
VLDL: 26 mg/dL (ref 0–40)

## 2016-04-29 LAB — BASIC METABOLIC PANEL
Anion gap: 8 (ref 5–15)
CALCIUM: 8.8 mg/dL — AB (ref 8.9–10.3)
CO2: 24 mmol/L (ref 22–32)
Chloride: 107 mmol/L (ref 101–111)
Creatinine, Ser: 1.06 mg/dL (ref 0.61–1.24)
GFR calc Af Amer: >60 mL/min (ref >60)
GLUCOSE: 109 mg/dL — AB (ref 65–99)
POTASSIUM: 3.8 mmol/L (ref 3.5–5.1)
SODIUM: 139 mmol/L (ref 135–145)

## 2016-04-29 LAB — ECHOCARDIOGRAM COMPLETE
Height: 66 in
Weight: 2536.17 oz

## 2016-04-29 LAB — CK TOTAL AND CKMB (NOT AT ARMC)
Total CK: 4635 U/L — ABNORMAL HIGH (ref 49–397)
Total CK: 2442 U/L — ABNORMAL HIGH (ref 49–397)

## 2016-04-29 LAB — CBC
HCT: 43.6 % (ref 39.0–52.0)
Hemoglobin: 14.9 g/dL (ref 13.0–17.0)
MCH: 33.3 pg (ref 26.0–34.0)
MCHC: 34.2 g/dL (ref 30.0–36.0)
MCV: 97.5 fL (ref 78.0–100.0)
PLATELETS: 191 10*3/uL (ref 150–400)
RBC: 4.47 MIL/uL (ref 4.22–5.81)
RDW: 13 % (ref 11.5–15.5)
WBC: 11.8 10*3/uL — AB (ref 4.0–10.5)

## 2016-04-29 LAB — HEMOGLOBIN A1C
Hgb A1c MFr Bld: 5.1 % (ref 4.8–5.6)
MEAN PLASMA GLUCOSE: 100 mg/dL

## 2016-04-29 LAB — TROPONIN I: Troponin I: 48.67 ng/mL (ref <0.031)

## 2016-04-29 MED ORDER — NICOTINE 21 MG/24HR TD PT24
21.0000 mg | MEDICATED_PATCH | Freq: Every day | TRANSDERMAL | Status: DC
  Administered 2016-04-29 – 2016-04-30 (×2): 21 mg via TRANSDERMAL
  Filled 2016-04-29 (×2): qty 1

## 2016-04-29 MED ORDER — AMIODARONE HCL IN DEXTROSE 360-4.14 MG/200ML-% IV SOLN
30.0000 mg/h | INTRAVENOUS | Status: AC
  Filled 2016-04-29: qty 200

## 2016-04-29 NOTE — Progress Notes (Signed)
CRITICAL VALUE ALERT  Critical value received:  Troponin > 65  Date of notification:  04/29/2016  Time of notification:  0055  Critical value read back:Yes.    MD notified (1st page): MD Allena KatzPatel paged at [phone].   MD returned call at 0057 and informed of troponin value.

## 2016-04-29 NOTE — Progress Notes (Signed)
*  PRELIMINARY RESULTS* Echocardiogram 2D Echocardiogram has been performed.  Caleb Grant, Caleb Grant 04/29/2016, 11:57 AM

## 2016-04-29 NOTE — Care Management Note (Signed)
Case Management Note  Patient Details  Name: Caleb Grant MRN: [id number] Date of Birth: 02/28/1971  Subjective/Objective:       Adm w stemi             Action/Plan:lives w fam   Expected Discharge Date:                  Expected Discharge Plan:  Home/Self Care  In-House Referral:     Discharge planning Services  CM Consult, Indigent Health Clinic, Medication Assistance  Post Acute Care Choice:    Choice offered to:     DME Arranged:    DME Agency:     HH Arranged:    HH Agency:     Status of Service:     If discussed at [employer] Meetings, dates discussed:    Additional Comments:gave pt 30day free brilinta card. Enc pt to call 800 phone number to get addit 60days. Pt assist form on shadow chart. No ins at present. Pt does have private phy he pays out of pocket for. Gave pt inform on [facility].   Hanley Haysowell, Cordaro Mukai T, RN 04/29/2016, 9:49 AM

## 2016-04-29 NOTE — Progress Notes (Signed)
SUBJECTIVE:  No chest pain.  Some ectopy and nonsustained VTach.  OBJECTIVE:   Vitals:   Filed Vitals:   04/29/16 0700 04/29/16 0800 04/29/16 0850 04/29/16 0905  BP: 112/83 101/88 113/77 102/88  Pulse: 57 64 65 73  Temp:    98.2 F (36.8 C)  TempSrc:    Oral  Resp:      Height:      Weight:      SpO2: 98% 97% 98% 98%   I&O's:   Intake/Output Summary (Last 24 hours) at 04/29/16 1012 Last data filed at 04/29/16 0800  Gross per 24 hour  Intake 1074.31 ml  Output   1125 ml  Net -50.69 ml   TELEMETRY: Reviewed telemetry pt in *NSR, PVCs:     PHYSICAL EXAM General: Well developed, well nourished, in no acute distress Head:   Normal cephalic and atramatic  Lungs:   Clear bilaterally to auscultation. Heart:   HRRR S1 S2  No JVD.   Abdomen: abdomen soft and non-tender Msk:  Back normal,  Normal strength and tone for age. Extremities:   No edema.  3+ right radial pulse Neuro: Alert and oriented. Psych:  Normal affect, responds appropriately Skin: No rash   LABS: Basic Metabolic Panel:  Recent Labs  16/08/9605/21/17 1652 04/28/16 1920 04/29/16 0656  NA 138  --  139  K 3.9  --  3.8  CL 106  --  107  CO2 24  --  24  GLUCOSE 123*  --  109*  BUN 7  --  <5*  CREATININE 1.21 1.16 1.06  CALCIUM 9.3  --  8.8*   Liver Function Tests: No results for input(s): AST, ALT, ALKPHOS, BILITOT, PROT, ALBUMIN in the last 72 hours. No results for input(s): LIPASE, AMYLASE in the last 72 hours. CBC:  Recent Labs  04/28/16 1920 04/29/16 0656  WBC 16.9* 11.8*  NEUTROABS 14.0*  --   HGB 15.6 14.9  HCT 45.3 43.6  MCV 97.2 97.5  PLT 209 191   Cardiac Enzymes:  Recent Labs  04/28/16 1939 04/29/16 04/29/16 0656 04/29/16 0841  CKTOTAL 5172* 4635*  --  2442*  CKMB >260.0* >260.0*  --  >260.0*  TROPONINI  --  >65.00* >65.00*  --    BNP: Invalid input(s): POCBNP D-Dimer: No results for input(s): DDIMER in the last 72 hours. Hemoglobin A1C:  Recent Labs  04/28/16 1920    HGBA1C 5.1   Fasting Lipid Panel:  Recent Labs  04/29/16 0232  CHOL 117  HDL 21*  LDLCALC 70  TRIG [id number]  CHOLHDL 5.6   Thyroid Function Tests: No results for input(s): TSH, T4TOTAL, T3FREE, THYROIDAB in the last 72 hours.  Invalid input(s): FREET3 Anemia Panel: No results for input(s): VITAMINB12, FOLATE, FERRITIN, TIBC, IRON, RETICCTPCT in the last 72 hours. Coag Panel:   Lab Results  Component Value Date   INR 2.09* 04/28/2016    RADIOLOGY: Dg Chest Port 1 View  04/28/2016  CLINICAL DATA:  Severe center chest pain x4 hours. Hx of HTN. Smoker. EXAM: PORTABLE CHEST - 1 VIEW COMPARISON:  12/16/2009 FINDINGS: Somewhat prominent perihilar and bibasilar interstitial markings, similar to previous exam. No confluent airspace infiltrate. No overt interstitial edema. Heart size and mediastinal contours are within normal limits. No effusion. Visualized bones unremarkable. IMPRESSION: 1. Chronic perihilar and bibasilar interstitial prominence. No acute disease. Electronically Signed   By: Corlis Leak  Hassell M.D.   On: 04/28/2016 17:48      ASSESSMENT: Roosvelt Harps/PLAN:    1)  Acute posterolateral wall MI: Occluded OM, treated with DES.  No angina.  Still with some ectopy.  Stop amiodarone at this time.  Watch in CCU for arrhythmia.  2) Nicotine patch.  Stressed smoking cessation.   3) Hyperlipidemia: May be able to decrease Lipitor to 40 mg daily.    4) Change back to carvedilol at discharge.  Stressed importance of RF modification.  Corky CraftsJayadeep S Mitzi Lilja, MD  04/29/2016  10:12 AM

## 2016-04-29 NOTE — Progress Notes (Signed)
CARDIAC REHAB PHASE I   PRE:  Rate/Rhythm: 65 SR c/ PVCs  BP:  Sitting: 113/77        SaO2: 99 RA  MODE:  Ambulation: 350 ft   POST:  Rate/Rhythm: 87 bigeminy  BP:  Sitting: 102/88         SaO2: 98 RA  Pt ambulated 350 ft on RA, IV, handheld assist, steady gait, tolerated well with no complaints. Pt with frequent PVCs/bigeminy, denies any symptoms. Began MI/stent education with pt and fiancee at bedside.  Reviewed risk factors, tobacco cessation (gave pt fake cigarette), MI book, anti-platelet therapy, stent card, activity restrictions, ntg, exercise, and phase 2 cardiac rehab. Left heart healthy diet handouts for pt to review. Pt verbalized understanding, very overwhelmed. Pt agrees to phase 2 cardiac rehab referral, [facility]. Pt does not have insurance, on brilinta, case manager to see prior to discharge. Pt to chair after walk, call bell within reach. Will follow up tomorrow.   [phone] Joylene GrapesEmily C Cris Talavera, RN, BSN 04/29/2016 9:35 AM

## 2016-04-30 ENCOUNTER — Encounter (HOSPITAL_COMMUNITY): Payer: Self-pay | Admitting: Student

## 2016-04-30 ENCOUNTER — Telehealth: Payer: Self-pay | Admitting: Cardiovascular Disease

## 2016-04-30 DIAGNOSIS — I214 Non-ST elevation (NSTEMI) myocardial infarction: Principal | ICD-10-CM

## 2016-04-30 MED ORDER — ATORVASTATIN CALCIUM 80 MG PO TABS: 80.0000 mg | ORAL_TABLET | Freq: Every day | ORAL | Status: DC

## 2016-04-30 MED ORDER — METOPROLOL TARTRATE 25 MG PO TABS: 12.5000 mg | ORAL_TABLET | Freq: Two times a day (BID) | ORAL | Status: DC

## 2016-04-30 MED ORDER — NITROGLYCERIN 0.4 MG SL SUBL
0.4000 mg | SUBLINGUAL_TABLET | SUBLINGUAL | Status: DC | PRN
Start: 2016-04-30 — End: 2017-03-04

## 2016-04-30 MED ORDER — TICAGRELOR 90 MG PO TABS: 90.0000 mg | ORAL_TABLET | Freq: Two times a day (BID) | ORAL | Status: DC

## 2016-04-30 MED ORDER — ASPIRIN 81 MG PO TBEC: 81.0000 mg | DELAYED_RELEASE_TABLET | Freq: Every day | ORAL | Status: AC

## 2016-04-30 NOTE — Telephone Encounter (Signed)
Pt is still currently admitted to the hospital with no discharge summary provided yet. Triage to follow-up with the pt as a TOC call, [facility].

## 2016-04-30 NOTE — Progress Notes (Signed)
CARDIAC REHAB PHASE I   PRE:  Rate/Rhythm: 90 SR bigeminy PVCs  BP:  Supine: 88/60  Sitting: 97/60  Standing:    SaO2: 100%RA  MODE:  Ambulation: 700 ft   POST:  Rate/Rhythm: 104 ST bigeminy PVCs  BP:  Supine:   Sitting: 110/66  Standing:    SaO2: 100%RA 1002-1030 Pt walked 700 ft without dizziness. Gait steady. Rhythm remained with bigeminy PVCs with no runs of VT noted. No CP. To chair after walk. Reviewed heart healthy diet and stressed importance of brilinta with stent.    Luetta Nuttingharlene Savas Elvin, RN BSN  04/30/2016 10:27 AM

## 2016-04-30 NOTE — Progress Notes (Signed)
Gave pt 30day free brilinta card. Gave pt match letter where he can get other meds for low copay 34day supply. He cannot use match for psych or pain meds. Explained must use drug store attached to letter.

## 2016-04-30 NOTE — Telephone Encounter (Signed)
TOC patient-appt 05-07-16 at 830a Sally-900a Katie per WrightBrittainy

## 2016-04-30 NOTE — Discharge Summary (Signed)
Discharge Summary    Patient ID: Caleb Grant,  MRN: [id number], DOB/AGE: 1971-09-26 45 y.o.  Admit date: 04/28/2016 Discharge date: 04/30/2016  Primary Care Provider: No primary care provider on file. Primary Cardiologist: Dr. Angelena Form  Discharge Diagnoses    Principal Problem:   NSTEMI (non-ST elevated myocardial infarction) [facility]) Active Problems:   Tobacco abuse   NSVT (nonsustained ventricular tachycardia) ([place])   History of Present Illness     Caleb Grant is a 45 y.o. male with past medical history of tobacco abuse and family history of premature CAD who presented to [facility] ED on 04/28/2016 for evaluation of chest pain. He reported developing chest pressure earlier that afternoon which radiated into his right arm and was associated with dyspnea. Reported having a cardiac catheterization 5 years ago which did not show CAD.  While in the ED, he had an episode of rapid, wide-complex tachycardia consistent with NSVT. He continued to have 9/10 chest discomfort and was taken emergently for a cardiac catheterization.   Hospital Course     Consultants: None  His catheterization showed 100% stenosis of the 2nd Mrg with mild nonobstructive disease in the LAD and RCA. PCI was performed with a DES and there was a 0% residual stenosis. His EKG did not show ST elevation, therefore this event was classified as an NSTEMI. He was started on ASA and Brilinta along with statin and a BB. He was also started on an Amiodarone drip due to his NSVT.  The following morning, he denied any chest discomfort or dyspnea. He continued to have small episodes of NSVT with Ventricular Bigeminy. His Amiodarone drip was stopped and he was continued on his BB therapy. Labs showed an LDL of 70. Troponin peaked at > 65. An echocardiogram was performed later that day which showed an EF of 50-55% with posterior lateral wall hypokinesis. Mild MR was noted. He ambulated 350 ft with cardiac rehab without  difficulty.   On 04/30/2016, his telemetry continued to show episodes of ventricular bigeminy and frequent PVC's. No prolonged NSVT was noted. His Coreg was switched to Lopressor 12.'5mg'$  BID.  He ambulated another 700 ft without anginal symptoms. No runs of NSVT noted with ambulation, just bigeminy.  He was last examined by Dr. Irish Lack and deemed stable for discharge. Continued smoking cessation was advised. Case Management met with the patient and he was provided with a 30-day card for Brilinta. His patient assistance form for Brilinta was also completed. A 7-day TCM appointment has been arranged.  _____________  Discharge Vitals Blood pressure 90/68, pulse 37, temperature 97.9 F (36.6 C), temperature source Oral, resp. rate 15, height '5\' 6"'$  (1.676 m), weight 158 lb 8.2 oz (71.9 kg), SpO2 98 %.  Filed Weights   04/28/16 1915 04/29/16 0600  Weight: 155 lb 13.8 oz (70.7 kg) 158 lb 8.2 oz (71.9 kg)    Labs & Radiologic Studies     CBC  Recent Labs  04/28/16 1920 04/29/16 0656  WBC 16.9* 11.8*  NEUTROABS 14.0*  --   HGB 15.6 14.9  HCT 45.3 43.6  MCV 97.2 97.5  PLT 209 458   Basic Metabolic Panel  Recent Labs  04/28/16 1652 04/28/16 1920 04/29/16 0656  NA 138  --  139  K 3.9  --  3.8  CL 106  --  107  CO2 24  --  24  GLUCOSE 123*  --  109*  BUN 7  --  <5*  CREATININE 1.21 1.16  1.06  CALCIUM 9.3  --  8.8*   Liver Function Tests No results for input(s): AST, ALT, ALKPHOS, BILITOT, PROT, ALBUMIN in the last 72 hours. No results for input(s): LIPASE, AMYLASE in the last 72 hours. Cardiac Enzymes  Recent Labs  04/28/16 1939 04/29/16 04/29/16 0656 04/29/16 0841 04/29/16 1156  CKTOTAL 5172* 4635*  --  2442*  --   CKMB >260.0* >260.0*  --  >260.0*  --   TROPONINI  --  >65.00* >65.00*  --  48.67*   BNP Invalid input(s): POCBNP D-Dimer No results for input(s): DDIMER in the last 72 hours. Hemoglobin A1C  Recent Labs  04/28/16 1920  HGBA1C 5.1   Fasting Lipid  Panel  Recent Labs  04/29/16 0232  CHOL 117  HDL 21*  LDLCALC 70  TRIG 131  CHOLHDL 5.6    Dg Chest Port 1 View: 04/28/2016  CLINICAL DATA:  Severe center chest pain x4 hours. Hx of HTN. Smoker. EXAM: PORTABLE CHEST - 1 VIEW COMPARISON:  12/16/2009 FINDINGS: Somewhat prominent perihilar and bibasilar interstitial markings, similar to previous exam. No confluent airspace infiltrate. No overt interstitial edema. Heart size and mediastinal contours are within normal limits. No effusion. Visualized bones unremarkable. IMPRESSION: 1. Chronic perihilar and bibasilar interstitial prominence. No acute disease. Electronically Signed   By: Lucrezia Europe M.D.   On: 04/28/2016 17:48    Diagnostic Studies/Procedures     Cardiac Catheterization: 04/28/2016  Prox RCA lesion, 20% stenosed.  Prox LAD to Mid LAD lesion, 20% stenosed.  Mid LAD to Dist LAD lesion, 40% stenosed.  2nd Mrg lesion, 100% stenosed. Post intervention, there is a 0% residual stenosis. The lesion was not previously treated.  The left ventricular systolic function is normal.  1. Acute MI/NSTEMI secondary to occluded large first obtuse marginal branch. (No ST elevation on EKG).  2. Successful PTCA/DES x 1 obtuse marginal branch.  3. Mild non-obstructive disease in the LAD and RCA 4. Overall preserved LV systolic function  Recommendations: Will continue ASA and Brilinta for one year. Will start statin and beta blocker. Continue amiodarone drip tonight.    Echocardiogram: 04/29/2016 Study Conclusions - Left ventricle: Posterior lateral wall hypokinesis The cavity  size was normal. Wall thickness was increased in a pattern of  mild LVH. Systolic function was normal. The estimated ejection  fraction was in the range of 50% to 55%. Left ventricular  diastolic function parameters were normal. - Mitral valve: There was mild regurgitation. - Atrial septum: No defect or patent foramen ovale was identified.    Disposition     Pt is being discharged home today in good condition.  Follow-up Plans & Appointments    Follow-up Information    Follow up with Angelena Form, PA-C On 05/07/2016.   Specialties:  Cardiology, Radiology   Why:  Cardiology Hospital Follow-Up on 05/07/2016. Appointment with Pharmacist at 8:30AM, with Provider at [place] information:   [place] [phone]      Discharge Instructions    Amb Referral to Cardiac Rehabilitation    Complete by:  As directed   Diagnosis:   NSTEMI Coronary Stents             Discharge Medications   Current Discharge Medication List    START taking these medications   Details  aspirin EC 81 MG EC tablet Take 1 tablet (81 mg total) by mouth daily.    atorvastatin (LIPITOR) 80 MG tablet Take 1 tablet (80  mg total) by mouth daily at 6 PM. Qty: 30 tablet, Refills: 6    metoprolol tartrate (LOPRESSOR) 25 MG tablet Take 0.5 tablets (12.5 mg total) by mouth 2 (two) times daily. Qty: 60 tablet, Refills: 6    nitroGLYCERIN (NITROSTAT) 0.4 MG SL tablet Place 1 tablet (0.4 mg total) under the tongue every 5 (five) minutes x 3 doses as needed for chest pain. Qty: 25 tablet, Refills: 3    ticagrelor (BRILINTA) 90 MG TABS tablet Take 1 tablet (90 mg total) by mouth 2 (two) times daily. Qty: 60 tablet, Refills: 11      STOP taking these medications     carvedilol (COREG) 6.25 MG tablet          Aspirin prescribed at discharge?  Yes High Intensity Statin Prescribed? (Lipitor 40-'80mg'$  or Crestor 20-'40mg'$ ): Yes Beta Blocker Prescribed? Yes For EF 40% or less, Was ACEI/ARB Prescribed? No: N/A ADP Receptor Inhibitor Prescribed? (i.e. Plavix etc.-Includes Medically Managed Patients): Yes For EF <40%, Aldosterone Inhibitor Prescribed? No: N/A Was EF assessed during THIS hospitalization? Yes - Cardiac Cath and Echo Was Cardiac Rehab II ordered? (Included Medically managed Patients): Yes    Allergies No Known Allergies   Outstanding Labs/Studies   None  Duration of Discharge Encounter   Greater than 30 minutes including physician time.  Signed, Erma Heritage, PA-C 04/30/2016, 3:03 PM   I have examined the patient and reviewed assessment and plan and discussed with patient.  Agree with above as stated.   1) Acute posterolateral wall MI: Occluded OM, treated with DES. No angina. Still with some ectopy. Stopped amiodarone yesterday; no prolonged NSVT. Will see how he does during the day.  Will change Coreg to metoprolol. BP has been well controlled. Walked with rehab.  No chest pain.  No NSVT.  2) Nicotine patch. Stressed smoking cessation. He feels less cravings.  3) Hyperlipidemia: May be able to decrease Lipitor to 40 mg daily.   Stressed importance of RF modification.   d/ctoday.    Larae Grooms

## 2016-04-30 NOTE — Progress Notes (Signed)
SUBJECTIVE:  No chest pain.  Feels well. Some ectopy and nonsustained VTach. Bigeminy this AM.  OBJECTIVE:   Vitals:   Filed Vitals:   04/30/16 0300 04/30/16 0657 04/30/16 0700 04/30/16 0800  BP: 94/55 93/58 97/66  94/60  Pulse: 36 38 37 43  Temp:      TempSrc:      Resp: 21 20 18 17   Height:      Weight:      SpO2: 99% 99% 98% 100%   I&O's:    Intake/Output Summary (Last 24 hours) at 04/30/16 0941 Last data filed at 04/30/16 0800  Gross per 24 hour  Intake 795.69 ml  Output   2200 ml  Net -1404.31 ml   TELEMETRY: Reviewed telemetry pt in *NSR, PVCs, bigeminy:     PHYSICAL EXAM General: Well developed, well nourished, in no acute distress Head:   Normal cephalic and atramatic  Lungs:   Clear bilaterally to auscultation. Heart:   HRRR S1 S2  No JVD.   Abdomen: abdomen soft and non-tender Msk:  Back normal,  Normal strength and tone for age. Extremities:   No edema.  3+ right radial pulse Neuro: Alert and oriented. Psych:  Normal affect, responds appropriately Skin: No rash   LABS: Basic Metabolic Panel:  Recent Labs  16/08/9605/21/17 1652 04/28/16 1920 04/29/16 0656  NA 138  --  139  K 3.9  --  3.8  CL 106  --  107  CO2 24  --  24  GLUCOSE 123*  --  109*  BUN 7  --  <5*  CREATININE 1.21 1.16 1.06  CALCIUM 9.3  --  8.8*   Liver Function Tests: No results for input(s): AST, ALT, ALKPHOS, BILITOT, PROT, ALBUMIN in the last 72 hours. No results for input(s): LIPASE, AMYLASE in the last 72 hours. CBC:  Recent Labs  04/28/16 1920 04/29/16 0656  WBC 16.9* 11.8*  NEUTROABS 14.0*  --   HGB 15.6 14.9  HCT 45.3 43.6  MCV 97.2 97.5  PLT 209 191   Cardiac Enzymes:  Recent Labs  04/28/16 1939 04/29/16 04/29/16 0656 04/29/16 0841 04/29/16 1156  CKTOTAL 5172* 4635*  --  2442*  --   CKMB >260.0* >260.0*  --  >260.0*  --   TROPONINI  --  >65.00* >65.00*  --  48.67*   BNP: Invalid input(s): POCBNP D-Dimer: No results for input(s): DDIMER in the last 72  hours. Hemoglobin A1C:  Recent Labs  04/28/16 1920  HGBA1C 5.1   Fasting Lipid Panel:  Recent Labs  04/29/16 0232  CHOL 117  HDL 21*  LDLCALC 70  TRIG [id number]  CHOLHDL 5.6   Thyroid Function Tests: No results for input(s): TSH, T4TOTAL, T3FREE, THYROIDAB in the last 72 hours.  Invalid input(s): FREET3 Anemia Panel: No results for input(s): VITAMINB12, FOLATE, FERRITIN, TIBC, IRON, RETICCTPCT in the last 72 hours. Coag Panel:   Lab Results  Component Value Date   INR 2.09* 04/28/2016    RADIOLOGY: Dg Chest Port 1 View  04/28/2016  CLINICAL DATA:  Severe center chest pain x4 hours. Hx of HTN. Smoker. EXAM: PORTABLE CHEST - 1 VIEW COMPARISON:  12/16/2009 FINDINGS: Somewhat prominent perihilar and bibasilar interstitial markings, similar to previous exam. No confluent airspace infiltrate. No overt interstitial edema. Heart size and mediastinal contours are within normal limits. No effusion. Visualized bones unremarkable. IMPRESSION: 1. Chronic perihilar and bibasilar interstitial prominence. No acute disease. Electronically Signed   By: Corlis Leak  Hassell M.D.   On: 04/28/2016  17:48      ASSESSMENT: Roosvelt Harps/PLAN:    1) Acute posterolateral wall MI: Occluded OM, treated with DES.  No angina.  Still with some ectopy.  Stopped amiodarone yesterday; no prolonged NSVT.  Will see how he does during the day.  COuld consider discharge later today.  Will change Coreg to metoprolol.  BO has been well controlled  2) Nicotine patch.  Stressed smoking cessation.  He feels less cravings.  3) Hyperlipidemia: May be able to decrease Lipitor to 40 mg daily.    Stressed importance of RF modification.  Possible d/c later today.  Otherwise tomorrow.    Corky CraftsJayadeep S Neena Beecham, MD  04/30/2016  9:41 AM

## 2016-05-03 NOTE — Telephone Encounter (Signed)
Patient contacted regarding discharge from [facility] on April 30, 2016.  Patient understands to follow up with provider Bary CastillaKaty Thompson, PA-C on May 03, 2016 at [facility] at [facility]. Patient understands discharge instructions? yes Patient understands medications and regiment? yes Patient understands to bring all medications to this visit? yes

## 2016-05-06 NOTE — Progress Notes (Signed)
Cardiology Office Note    Date:  05/07/2016   ID:  Caleb Grant, DOB 11-24-1970, MRN [id number]  PCP:  No primary care provider on file.  Cardiologist:  Dr. Clifton Jaxtin  CC: post hospital follow up  History of Present Illness:  Caleb Grant is a 45 y.o. male with a history of tobacco abuse, family hx of premature CAD and recently diagnosed CAD: NSTEMI s/p DES to 2nd Mrg, mild MR and NSVT who presents to clinic for post hospital follow up.   He was recently admitted to [facility] from 6/21-6/23/17. He presented with chest pain.  POC troponin was 0.66. While in the ED, he had an episode of rapid, wide-complex tachycardia consistent with NSVT. He continued to have 9/10 chest discomfort and was taken emergently for a cardiac catheterization. His catheterization showed 100% stenosis of the 2nd Mrg with mild nonobstructive disease in the LAD and RCA. PCI was performed with a DES and there was a 0% residual stenosis. His EKG did not show ST elevation, therefore this event was classified as an NSTEMI. He was started on ASA and Brilinta along with statin and a BB. He was also started on an Amiodarone drip due to his NSVT. The following morning, he denied any chest discomfort or dyspnea. He continued to have small episodes of NSVT with Ventricular Bigeminy. His Amiodarone drip was stopped and he was continued on his BB therapy. Labs showed an LDL of 70. Troponin peaked at > 65. An echocardiogram was performed later that day which showed an EF of 50-55% with posterior lateral wall hypokinesis. Mild MR was noted.   Today he presents to clinic for follow up. He has had no chest pain or SOB. He has been walking 2x day for 10 minutes with no chest pain or SOB. No palpitations. No LE edema, orthopnea or PND. No dizziness or syncope. Still smoking 3/4 PPD. Wants to go back to go back to work, he is a [employer] architect.      Past Medical History  Diagnosis Date  . Tobacco abuse   . CAD (coronary artery disease)     a.  04/2016: NSTEMI and NSVT: Cath showing 100% stenosis 2nd Mrg (PCI w/ DES), 20% stenosis mid-LAD, 40% distal LAD, and 20% proxRCA   . Ventricular bigeminy     a. noted during 04/2016 admission. Started on BB.    Past Surgical History  Procedure Laterality Date  . Cardiac catheterization N/A 04/28/2016    Procedure: Left Heart Cath and Coronary Angiography;  Surgeon: Kathleene Hazel, MD;  Location: [facility] INVASIVE CV LAB;  Service: Cardiovascular;  Laterality: N/A;  . Cardiac catheterization N/A 04/28/2016    Procedure: Coronary Stent Intervention;  Surgeon: Kathleene Hazel, MD;  Location: [facility] INVASIVE CV LAB;  Service: Cardiovascular;  Laterality: N/A;    Current Medications: Outpatient Prescriptions Prior to Visit  Medication Sig Dispense Refill  . aspirin EC 81 MG EC tablet Take 1 tablet (81 mg total) by mouth daily.    Marland Kitchen atorvastatin (LIPITOR) 80 MG tablet Take 1 tablet (80 mg total) by mouth daily at 6 PM. 30 tablet 6  . metoprolol tartrate (LOPRESSOR) 25 MG tablet Take 0.5 tablets (12.5 mg total) by mouth 2 (two) times daily. 60 tablet 6  . nitroGLYCERIN (NITROSTAT) 0.4 MG SL tablet Place 1 tablet (0.4 mg total) under the tongue every 5 (five) minutes x 3 doses as needed for chest pain. 25 tablet 3  . ticagrelor (BRILINTA) 90 MG TABS  tablet Take 1 tablet (90 mg total) by mouth 2 (two) times daily. 60 tablet 11   No facility-administered medications prior to visit.     Allergies:   Review of patient's allergies indicates no known allergies.   Social History   Social History  . Marital Status: Married    Spouse Name: N/A  . Number of Children: N/A  . Years of Education: N/A   Social History Main Topics  . Smoking status: Current Every Day Smoker -- 2.00 packs/day    Types: Cigarettes  . Smokeless tobacco: None  . Alcohol Use: No  . Drug Use: None  . Sexual Activity: Not Asked   Other Topics Concern  . None   Social History Narrative     Family History:  The  patient's family history includes Coronary artery disease in his father and paternal grandfather; Heart disease in his sister; Heart failure in his sister.     ROS:   Please see the history of present illness.    ROS All other systems reviewed and are negative.   PHYSICAL EXAM:   VS:  BP 118/84 mmHg  Pulse 78  Ht 5\' 7"  (1.702 m)  Wt 157 lb (71.215 kg)  BMI 24.58 kg/m2   GEN: Well nourished, well developed, in no acute distress HEENT: normal Neck: no JVD, carotid bruits, or masses Cardiac: RRR; no murmurs, rubs, or gallops,no edema  Respiratory:  clear to auscultation bilaterally, normal work of breathing GI: soft, nontender, nondistended, + BS MS: no deformity or atrophy Skin: warm and dry, no rash Neuro:  Alert and Oriented x 3, Strength and sensation are intact Psych: euthymic mood, full affect  Wt Readings from Last 3 Encounters:  05/07/16 157 lb (71.215 kg)  04/29/16 158 lb 8.2 oz (71.9 kg)      Studies/Labs Reviewed:   EKG:  EKG is ordered today.  The ekg ordered today demonstrates NSR HR 78. Non specific ST/TW abnormality in lateral leads.   Recent Labs: 04/29/2016: BUN <5*; Creatinine, Ser 1.06; Hemoglobin 14.9; Platelets 191; Potassium 3.8; Sodium 139   Lipid Panel    Component Value Date/Time   CHOL 117 04/29/2016 0232   TRIG 131 04/29/2016 0232   HDL 21* 04/29/2016 0232   CHOLHDL 5.6 04/29/2016 0232   VLDL 26 04/29/2016 0232   LDLCALC 70 04/29/2016 0232    Additional studies/ records that were reviewed today include:  Cardiac Catheterization: 04/28/2016  Prox RCA lesion, 20% stenosed.  Prox LAD to Mid LAD lesion, 20% stenosed.  Mid LAD to Dist LAD lesion, 40% stenosed.  2nd Mrg lesion, 100% stenosed. Post intervention, there is a 0% residual stenosis. The lesion was not previously treated.  The left ventricular systolic function is normal.  1. Acute MI/NSTEMI secondary to occluded large first obtuse marginal branch. (No ST elevation on EKG).  2.  Successful PTCA/DES x 1 obtuse marginal branch.  3. Mild non-obstructive disease in the LAD and RCA 4. Overall preserved LV systolic function  Recommendations: Will continue ASA and Brilinta for one year. Will start statin and beta blocker. Continue amiodarone drip tonight.    Echocardiogram: 04/29/2016 Study Conclusions - Left ventricle: Posterior lateral wall hypokinesis The cavity  size was normal. Wall thickness was increased in a pattern of  mild LVH. Systolic function was normal. The estimated ejection  fraction was in the range of 50% to 55%. Left ventricular  diastolic function parameters were normal. - Mitral valve: There was mild regurgitation. - Atrial septum: No defect  or patent foramen ovale was identified.    ASSESSMENT & PLAN:   Kinnie FeilJames W Sconyers is a 45 y.o. male with a history of tobacco abuse, family hx of premature CAD and recently diagnosed CAD: NSTEMI s/p DES to 2nd Mrg, mild MR and NSVT who presents to clinic for post hospital follow up.   CAD: continue ASA/Brilinta, statin and BB  NSVT: continue BB  Mild MR: continue to follow  Tobacco abuse: cut down from 1.5 PPD to 3/4 PPD. Willing to try Wellbutrin SR 150mg  qd x 3 days followed by 150mg  BID thereafter.    Medication Adjustments/Labs and Tests Ordered: Current medicines are reviewed at length with the patient today.  Concerns regarding medicines are outlined above.  Medication changes, Labs and Tests ordered today are listed in the Patient Instructions below. Patient Instructions  Medication Instructions:  Your physician has recommended you make the following change in your medication:  1.  START the Zyban 150 mg taking 1 tablet X's 3 days then increase it to 1 tablet twice a day   Labwork: None ordered  Testing/Procedures: None ordered  Follow-Up: Your physician recommends that you schedule a follow-up appointment in: 3 MONTHS WITH DR. Clifton JamesMCALHANY   Any Other Special Instructions Will Be  Listed Below (If Applicable).   If you need a refill on your cardiac medications before your next appointment, please call your pharmacy.       Signed, Cline CrockKathryn Thompson, PA-C  05/07/2016 9:46 AM    [facility] [address], [place], [place]  [address] Phone: [phone]; Fax: [phone]

## 2016-05-07 ENCOUNTER — Encounter: Payer: Self-pay | Admitting: Physician Assistant

## 2016-05-07 ENCOUNTER — Encounter: Payer: Self-pay | Admitting: *Deleted

## 2016-05-07 ENCOUNTER — Ambulatory Visit (INDEPENDENT_AMBULATORY_CARE_PROVIDER_SITE_OTHER): Payer: Self-pay | Admitting: Pharmacist

## 2016-05-07 ENCOUNTER — Ambulatory Visit (INDEPENDENT_AMBULATORY_CARE_PROVIDER_SITE_OTHER): Payer: Self-pay | Admitting: Physician Assistant

## 2016-05-07 VITALS — BP 118/84 | HR 78 | Ht 67.0 in | Wt 157.0 lb

## 2016-05-07 DIAGNOSIS — I472 Ventricular tachycardia: Secondary | ICD-10-CM

## 2016-05-07 DIAGNOSIS — Z720 Tobacco use: Secondary | ICD-10-CM

## 2016-05-07 DIAGNOSIS — I214 Non-ST elevation (NSTEMI) myocardial infarction: Secondary | ICD-10-CM

## 2016-05-07 DIAGNOSIS — I4729 Other ventricular tachycardia: Secondary | ICD-10-CM

## 2016-05-07 MED ORDER — BUPROPION HCL ER (SR) 150 MG PO TB12: ORAL_TABLET | ORAL | Status: DC

## 2016-05-07 NOTE — Patient Instructions (Addendum)
Medication Instructions:  Your physician has recommended you make the following change in your medication:  1.  START the Zyban 150 mg taking 1 tablet X's 3 days then increase it to 1 tablet twice a day   Labwork: None ordered  Testing/Procedures: None ordered  Follow-Up: Your physician recommends that you schedule a follow-up appointment in: 3 MONTHS WITH DR. Clifton JamesMCALHANY   Any Other Special Instructions Will Be Listed Below (If Applicable).   If you need a refill on your cardiac medications before your next appointment, please call your pharmacy.

## 2016-05-07 NOTE — Progress Notes (Signed)
Patient ID: Caleb FeilJames W Grant                 DOB: 02/17/1971                      MRN: [id number]    Pharmacy Transitions of Care Visit  HPI: Caleb Grant is a 45 y.o. male admitted from 6/21-6/23 with primary diagnosis of NSTEMI. PMH is significant for tobacco use and premature family history of CAD. He presented to the ED on 6/21 after developing chest pressure that radiated to his arm and was associated with dyspnea.  He was taken emergently to cardiac cath.  He had 100% stenosis of 2nd mrg that was treated with DES and nonobstructive disease in the LAD and RCA.  He was started on ASA, Brilinta, and carvedilol.  He had some runs of NSVT so amiodarone drip was started.  This was stopped on 6/23 but he continued to have ventricular bigeminy and frequent PVCs.  Coreg was switched to metoprolol.   Smoking cessation was encouraged but no pharmacological therapy prescribed.  Patient presents to clinic for pharmacy transitions of care medication reconciliation after hospital discharge.  All medications have been reviewed with patient.  Labs:  SCr- 1.06, Potassium 3.8, LDL 70  Issues/concerns noted are as follows: 1. Pt does not have insurance.  He was given the 30 day free card of Brilinta.  He has the paperwork for patient assistance but has not completed it yet.  2. Smoking cessation- Pt continues to smoke 3/4 ppd.  He was previously at 1 1/2 ppd prior to hospitalization.  He states he is at a 4-5 out of 10 pt scale for readiness to quit.  He has tried Chantix in the past and had problems with side effects.  He has never tried any other therapy to help quit.  He states stress is his trigger.  He did quit once in the past.    Past Medical History  Diagnosis Date  . Tobacco abuse   . CAD (coronary artery disease)     a. 04/2016: NSTEMI and NSVT: Cath showing 100% stenosis 2nd Mrg (PCI w/ DES), 20% stenosis mid-LAD, 40% distal LAD, and 20% proxRCA   . Ventricular bigeminy     a. noted during 04/2016  admission. Started on BB.    Current Outpatient Prescriptions on File Prior to Visit  Medication Sig Dispense Refill  . aspirin EC 81 MG EC tablet Take 1 tablet (81 mg total) by mouth daily.    Marland Kitchen. atorvastatin (LIPITOR) 80 MG tablet Take 1 tablet (80 mg total) by mouth daily at 6 PM. 30 tablet 6  . metoprolol tartrate (LOPRESSOR) 25 MG tablet Take 0.5 tablets (12.5 mg total) by mouth 2 (two) times daily. 60 tablet 6  . nitroGLYCERIN (NITROSTAT) 0.4 MG SL tablet Place 1 tablet (0.4 mg total) under the tongue every 5 (five) minutes x 3 doses as needed for chest pain. 25 tablet 3  . ticagrelor (BRILINTA) 90 MG TABS tablet Take 1 tablet (90 mg total) by mouth 2 (two) times daily. 60 tablet 11   No current facility-administered medications on file prior to visit.    No Known Allergies  Assessment/Plan:  1. Tobacco use- Pt has cut down on his cigarette use but seems reluctant to completely stop.  He is willing to try something to help.  Suggest trying Zyban and he can still use nicotine replacement therapy if need be.

## 2016-05-21 ENCOUNTER — Telehealth (HOSPITAL_COMMUNITY): Payer: Self-pay | Admitting: Cardiac Rehabilitation

## 2016-05-21 NOTE — Telephone Encounter (Signed)
Pt wife called to schedule cardiac rehab appointments. Pt is interested in maintenance program.  Referral sent for maintenance.  Pt wife informed of departmental processes prior to scheduling.  Will call pt back schedule once maintenance order received.  Understanding verbalized.

## 2016-06-18 ENCOUNTER — Telehealth (HOSPITAL_COMMUNITY): Payer: Self-pay | Admitting: Cardiac Rehabilitation

## 2016-07-21 ENCOUNTER — Encounter (HOSPITAL_COMMUNITY): Payer: Self-pay | Admitting: *Deleted

## 2016-08-02 ENCOUNTER — Ambulatory Visit: Payer: Self-pay | Admitting: Cardiovascular Disease

## 2016-08-02 NOTE — Progress Notes (Deleted)
No chief complaint on file.    History of Present Illness: 45 yo male with history of CAD, tobacco abuse, PVCs who is here today for follow up. He was admitted to [facility] June 2017 with a NSTEMI, non-sustained VT. Emergent cardiac cath with occluded second obtuse marginal branch, treated with a DES x 1. There was mild non-obstructive disease in the LAD and RCA. He was started on a beta blocker for frequent ventricular ectopy. Echo June 2017 with LVEF=50-55% with posterolateral wall hypokinesis, mild MR.   He is here today for follow up. He denies chest pain, dyspnea, palpitations, LE edema, orthopnea, PND. He is still smoking.   Primary Care Physician:  No primary care provider on file.   Past Medical History:  Diagnosis Date  . CAD (coronary artery disease)    a. 04/2016: NSTEMI and NSVT: Cath showing 100% stenosis 2nd Mrg (PCI w/ DES), 20% stenosis mid-LAD, 40% distal LAD, and 20% proxRCA   . Tobacco abuse   . Ventricular bigeminy    a. noted during 04/2016 admission. Started on BB.    Past Surgical History:  Procedure Laterality Date  . CARDIAC CATHETERIZATION N/A 04/28/2016   Procedure: Left Heart Cath and Coronary Angiography;  Surgeon: Kathleene Hazel, MD;  Location: [facility] INVASIVE CV LAB;  Service: Cardiovascular;  Laterality: N/A;  . CARDIAC CATHETERIZATION N/A 04/28/2016   Procedure: Coronary Stent Intervention;  Surgeon: Kathleene Hazel, MD;  Location: [facility] INVASIVE CV LAB;  Service: Cardiovascular;  Laterality: N/A;    Current Outpatient Prescriptions  Medication Sig Dispense Refill  . aspirin EC 81 MG EC tablet Take 1 tablet (81 mg total) by mouth daily.    Marland Kitchen atorvastatin (LIPITOR) 80 MG tablet Take 1 tablet (80 mg total) by mouth daily at 6 PM. 30 tablet 6  . buPROPion (WELLBUTRIN SR) 150 MG 12 hr tablet TAKE 1 TABLET BY MOUTH X'S 3 DAYS THEN INCREASE IT TO 1 TABLET BY MOUTH TWICE A DAY 60 tablet 1  . metoprolol tartrate (LOPRESSOR) 25 MG tablet Take 0.5 tablets  (12.5 mg total) by mouth 2 (two) times daily. 60 tablet 6  . nitroGLYCERIN (NITROSTAT) 0.4 MG SL tablet Place 1 tablet (0.4 mg total) under the tongue every 5 (five) minutes x 3 doses as needed for chest pain. 25 tablet 3  . ticagrelor (BRILINTA) 90 MG TABS tablet Take 1 tablet (90 mg total) by mouth 2 (two) times daily. 60 tablet 11   No current facility-administered medications for this visit.     No Known Allergies  Social History   Social History  . Marital status: Married    Spouse name: N/A  . Number of children: N/A  . Years of education: N/A   Occupational History  . Not on file.   Social History Main Topics  . Smoking status: Current Every Day Smoker    Packs/day: 2.00    Types: Cigarettes  . Smokeless tobacco: Not on file  . Alcohol use No  . Drug use: Unknown  . Sexual activity: Not on file   Other Topics Concern  . Not on file   Social History Narrative  . No narrative on file    Family History  Problem Relation Age of Onset  . Coronary artery disease Father   . Coronary artery disease Paternal Grandfather   . Heart disease Sister   . Heart failure Sister     Review of Systems:  As stated in the HPI and otherwise negative.  There were no vitals taken for this visit.  Physical Examination: General: Well developed, well nourished, NAD  HEENT: OP clear, mucus membranes moist  SKIN: warm, dry. No rashes. Neuro: No focal deficits  Musculoskeletal: Muscle strength 5/5 all ext  Psychiatric: Mood and affect normal  Neck: No JVD, no carotid bruits, no thyromegaly, no lymphadenopathy.  Lungs:Clear bilaterally, no wheezes, rhonci, crackles Cardiovascular: Regular rate and rhythm. No murmurs, gallops or rubs. Abdomen:Soft. Bowel sounds present. Non-tender.  Extremities: No lower extremity edema. Pulses are 2 + in the bilateral DP/PT.  EKG:  EKG {ACTION; IS/IS ZOX:[id number]}OT:21021397} ordered today. The ekg ordered today demonstrates ***  Recent Labs: 04/29/2016:  BUN <5; Creatinine, Ser 1.06; Hemoglobin 14.9; Platelets 191; Potassium 3.8; Sodium 139   Lipid Panel    Component Value Date/Time   CHOL 117 04/29/2016 0232   TRIG 131 04/29/2016 0232   HDL 21 (L) 04/29/2016 0232   CHOLHDL 5.6 04/29/2016 0232   VLDL 26 04/29/2016 0232   LDLCALC 70 04/29/2016 0232     Wt Readings from Last 3 Encounters:  05/07/16 71.2 kg (157 lb)  04/29/16 71.9 kg (158 lb 8.2 oz)     Other studies Reviewed: Additional studies/ records that were reviewed today include: ***. Review of the above records demonstrates: ***  Assessment and Plan:   1. CAD: He is s/p recent NSTEMI secondary to occluded OM2. A DES was placed. He has had no chest pain suggestive of angina. He is on ASA, Brilinta, statin and a beta blocker.   2. Non-sustained VT: No palpitations. Continue beta blocker.   3. Mitral regurgitation: Mild by echo June 2017.   4. Tobacco abuse: Smoking cessation is advised.   Current medicines are reviewed at length with the patient today.  The patient {ACTIONS; HAS/DOES NOT HAVE:19233} concerns regarding medicines.  The following changes have been made:  {PLAN; NO CHANGE:13088:s}  Labs/ tests ordered today include: *** No orders of the defined types were placed in this encounter.   Disposition:   FU with *** in {gen number [id number]}0-10:310397} {TIME; UNITS DAY/WEEK/MONTH:19136}  Signed, Verne Carrowhristopher McAlhany, MD 08/02/2016 10:21 AM    [facility] [address], [place], [place]  [address] Phone: [phone](336) [phone]; Fax: [phone](336) [phone]

## 2016-08-05 ENCOUNTER — Encounter: Payer: Self-pay | Admitting: Cardiology

## 2016-08-05 ENCOUNTER — Ambulatory Visit (INDEPENDENT_AMBULATORY_CARE_PROVIDER_SITE_OTHER): Payer: BLUE CROSS/BLUE SHIELD | Admitting: Cardiology

## 2016-08-05 VITALS — BP 126/80 | HR 62 | Ht 67.0 in | Wt 157.1 lb

## 2016-08-05 DIAGNOSIS — I10 Essential (primary) hypertension: Secondary | ICD-10-CM | POA: Diagnosis not present

## 2016-08-05 DIAGNOSIS — E785 Hyperlipidemia, unspecified: Secondary | ICD-10-CM | POA: Diagnosis not present

## 2016-08-05 DIAGNOSIS — I2510 Atherosclerotic heart disease of native coronary artery without angina pectoris: Secondary | ICD-10-CM

## 2016-08-05 NOTE — Progress Notes (Signed)
08/05/2016 Caleb Grant   07-01-1971  [id number]  Primary Physician Fredrik Cove, Joelene Millin, NP Primary Cardiologist: Dr. Clifton Jernard   Reason for Visit/CC: F/u for CAD  HPI:  Caleb Grant is a 45 y.o. male with a history of tobacco abuse, family hx of premature CAD and recently diagnosed CAD: NSTEMI s/p DES to 2nd Mrg, mild MR and NSVT who presents to clinic for post hospital follow up.   He was recently admitted to [facility] from 6/21-6/23/17. He presented with chest pain.  POC troponin was 0.66. While in the ED, he had an episode of rapid, wide-complex tachycardia consistent with NSVT. He continued to have 9/10 chest discomfort and was taken emergently for a cardiac catheterization. His catheterization showed 100% stenosis of the 2nd Mrg with mild nonobstructive disease in the LAD and RCA. PCI was performed with a DES and there was a 0% residual stenosis. His EKG did not show ST elevation, therefore this event was classified as an NSTEMI. He was started on ASA and Brilinta along with statin and a BB. He was also started on an Amiodarone drip due to his NSVT. The following morning, he denied any chest discomfort or dyspnea. He continued to have small episodes of NSVT with Ventricular Bigeminy. His Amiodarone drip was stopped and he was continued on his BB therapy. Labs showed an LDL of 70. Troponin peaked at > 65. An echocardiogram was performed later that day which showed an EF of 50-55% with posterior lateral wall hypokinesis. Mild MR was noted.  He was seen in clinic for post hospital f/u by Cline Crock, PA-C, on 05/07/16. He was doing well at that time but was still smoking. Wellbutrin was prescribed. He was instructed to follow-up in 3 months.  He presents back to clinic today for follow-up. He has continued to do well from a cardiac standpoint, denying recurrent angina. No dyspnea. He has not required use of sublingual nitroglycerin. He has been fully compliant with aspirin and Brilinta. He has  also been fully compliant with Lopressor and Lipitor. He reports that he had to discontinue Wellbutrin due to intolerance with muscle aches and pain. Symptoms resolved after discontinuation of Wellbutrin. He reports that he has tried other therapies for smoking cessation in the past including Chantix, nicotine patches,and gum without any success. At this point, he continues to smoke 3/4 packs per day.  He is without symptoms in clinic today. Vital signs are stable with blood pressure at 126/80. Pulse rate 62 bpm.  Current Meds  Medication Sig  . aspirin EC 81 MG EC tablet Take 1 tablet (81 mg total) by mouth daily.  Marland Kitchen atorvastatin (LIPITOR) 80 MG tablet Take 1 tablet (80 mg total) by mouth daily at 6 PM.  . buPROPion (WELLBUTRIN SR) 150 MG 12 hr tablet TAKE 1 TABLET BY MOUTH X'S 3 DAYS THEN INCREASE IT TO 1 TABLET BY MOUTH TWICE A DAY  . metoprolol tartrate (LOPRESSOR) 25 MG tablet Take 0.5 tablets (12.5 mg total) by mouth 2 (two) times daily.  . nitroGLYCERIN (NITROSTAT) 0.4 MG SL tablet Place 1 tablet (0.4 mg total) under the tongue every 5 (five) minutes x 3 doses as needed for chest pain.  . ticagrelor (BRILINTA) 90 MG TABS tablet Take 1 tablet (90 mg total) by mouth 2 (two) times daily.   No Known Allergies Past Medical History:  Diagnosis Date  . CAD (coronary artery disease)    a. 04/2016: NSTEMI and NSVT: Cath showing 100% stenosis 2nd Mrg (PCI  w/ DES), 20% stenosis mid-LAD, 40% distal LAD, and 20% proxRCA   . Tobacco abuse   . Ventricular bigeminy    a. noted during 04/2016 admission. Started on BB.   Family History  Problem Relation Age of Onset  . Coronary artery disease Father   . Coronary artery disease Paternal Grandfather   . Heart disease Sister   . Heart failure Sister    Past Surgical History:  Procedure Laterality Date  . CARDIAC CATHETERIZATION N/A 04/28/2016   Procedure: Left Heart Cath and Coronary Angiography;  Surgeon: Kathleene Hazelhristopher D McAlhany, MD;  [facility] LAB;  Service: Cardiovascular;  Laterality: N/A;  . CARDIAC CATHETERIZATION N/A 04/28/2016   Procedure: Coronary Stent Intervention;  Surgeon: Kathleene Hazelhristopher D McAlhany, MD;  Location: [facility] INVASIVE CV LAB;  Service: Cardiovascular;  Laterality: N/A;   Social History   Social History  . Marital status: Married    Spouse name: N/A  . Number of children: N/A  . Years of education: N/A   Occupational History  . Not on file.   Social History Main Topics  . Smoking status: Current Every Day Smoker    Packs/day: 2.00    Types: Cigarettes  . Smokeless tobacco: Not on file  . Alcohol use No  . Drug use: Unknown  . Sexual activity: Not on file   Other Topics Concern  . Not on file   Social History Narrative  . No narrative on file     Review of Systems: General: negative for chills, fever, night sweats or weight changes.  Cardiovascular: negative for chest pain, dyspnea on exertion, edema, orthopnea, palpitations, paroxysmal nocturnal dyspnea or shortness of breath Dermatological: negative for rash Respiratory: negative for cough or wheezing Urologic: negative for hematuria Abdominal: negative for nausea, vomiting, diarrhea, bright red blood per rectum, melena, or hematemesis Neurologic: negative for visual changes, syncope, or dizziness All other systems reviewed and are otherwise negative except as noted above.   Physical Exam:  Blood pressure 126/80, pulse 62, height 5\' 7"  (1.702 m), weight 157 lb 1.9 oz (71.3 kg).  General appearance: alert, cooperative and no distress Neck: no carotid bruit and no JVD Lungs: clear to auscultation bilaterally Heart: regular rate and rhythm, S1, S2 normal, no murmur, click, rub or gallop Extremities: no LEE Pulses: 2+ and symmetric Skin: warm and dry Neurologic: Grossly normal  EKG not performed   ASSESSMENT AND PLAN:   1. CAD: s/p NSTEMI 04/2015 treated with PCI + DES to 2nd Mrg with mild nonobstructive disease in the LAD and  RCA. Normal EF of 50-55%. He has remained stable w/o recurrent angina. No dyspnea. Continue DAPT with ASA + Brilinta for a minimum of 1 year for DES. Continue statin and BB. He has SL NTG at home.   2. Tobacco Abuse: intolerant to Wellbutrin (body aches) and Chantix (headahces). No success with nicotine patches/ gum. Still smoking 3/4 ppd. Patient encouraged to try to cut back if possible. Pt informed of risk of worsening CAD/MI if continued use.   3. HTN: BP is controlled on current regimen. HR stable. Continue metoprolol.   4. DLD: recent FLP 04/2016 showed LDL at goal at 70 mg/dL. Continue statin therapy with Lipitor.    PLAN  F/u with Dr. Clifton JamesMcAlhany in 3 months   Knute NeuBrittainy Simmons PA-C 08/05/2016 12:25 PM

## 2016-08-05 NOTE — Patient Instructions (Addendum)
Medication Instructions:   Your physician recommends that you continue on your current medications as directed. Please refer to the Current Medication list given to you today.   If you need a refill on your cardiac medications before your next appointment, please call your pharmacy.  Labwork: NONE ORDER TODAY    Testing/Procedures: NONE ORDER TODAY    Follow-Up: in 3 MONTHS WITH ONLY  DR Clifton JamesMCALHANY    Any Other Special Instructions Will Be Listed Below (If Applicable).

## 2016-08-06 ENCOUNTER — Ambulatory Visit: Payer: Self-pay | Admitting: Cardiovascular Disease

## 2016-08-10 ENCOUNTER — Telehealth: Payer: Self-pay | Admitting: Cardiovascular Disease

## 2016-08-10 NOTE — Telephone Encounter (Signed)
I spoke with pt who gave me verbal permission to speak with Carney BernKimberly Carpenter if needed.  Pt reports prior to office visit last week he noted bright red blood in commode a few times.  For the last few days this has happened with each bowel movement. Describes as bright red and water in bowl turns bright red. Usually has one bowel movement daily. Stools are normal color. Had procedure in past for treatment of hemorrhoids. Recent stent in June 2017. I instructed pt to contact primary care doctor for evaluation.  I told him he could also go to Urgent Care if needed as [place].

## 2016-08-10 NOTE — Telephone Encounter (Signed)
New message   Cala BradfordKimberly verbalized that pt is having severe rectal bleeding  Call Cala Bradfordkimberly because pt is at work- driving trucks-  I was going to offer an appt with scott for 08/11/16 but, the earliest that he can come in is Friday 08/13/16

## 2016-08-11 NOTE — Telephone Encounter (Signed)
Agree that this is likely due to hemorrhoids but he needs to see primary care/GI. cdm

## 2016-10-26 NOTE — Progress Notes (Deleted)
No chief complaint on file.    History of Present Illness: 45 yo male with history of CAD, tobacco abuse who is here today for cardiac follow up. [facility] with [facility]STEMI and VT. Cardiac cath with total occlusion of the second obtuse marginal branch, treated with a drug eluting stent. He was started on amiodarone for non-sustained VT. Echo June 2017 with LVEF=50-55%, mild MR. He did not stop smoking. He did not tolerate Wellbutrin.   He is here today for follow up.   Primary Care Physician: Egbert GaribaldiMillsaps, KIMBERLY M, NP   Past Medical History:  Diagnosis Date  . CAD (coronary artery disease)    a. 04/2016: NSTEMI and NSVT: Cath showing 100% stenosis 2nd Mrg (PCI w/ DES), 20% stenosis mid-LAD, 40% distal LAD, and 20% proxRCA   . Tobacco abuse   . Ventricular bigeminy    a. noted during 04/2016 admission. Started on BB.    Past Surgical History:  Procedure Laterality Date  . CARDIAC CATHETERIZATION N/A 04/28/2016   Procedure: Left Heart Cath and Coronary Angiography;  Surgeon: Kathleene Hazelhristopher D McAlhany, MD;  [facility];  Service: Cardiovascular;  Laterality: N/A;  . CARDIAC CATHETERIZATION N/A 04/28/2016   Procedure: Coronary Stent Intervention;  Surgeon: Kathleene Hazelhristopher D McAlhany, MD;  Location: [facility] INVASIVE CV LAB;  Service: Cardiovascular;  Laterality: N/A;    Current Outpatient Prescriptions  Medication Sig Dispense Refill  . aspirin EC 81 MG EC tablet Take 1 tablet (81 mg total) by mouth daily.    Marland Kitchen. atorvastatin (LIPITOR) 80 MG tablet Take 1 tablet (80 mg total) by mouth daily at 6 PM. 30 tablet 6  . buPROPion (WELLBUTRIN SR) 150 MG 12 hr tablet TAKE 1 TABLET BY MOUTH X'S 3 DAYS THEN INCREASE IT TO 1 TABLET BY MOUTH TWICE A DAY 60 tablet 1  . metoprolol tartrate (LOPRESSOR) 25 MG tablet Take 0.5 tablets (12.5 mg total) by mouth 2 (two) times daily. 60 tablet 6  . nitroGLYCERIN (NITROSTAT) 0.4 MG SL tablet Place 1 tablet (0.4 mg total) under the tongue  every 5 (five) minutes x 3 doses as needed for chest pain. 25 tablet 3  . ticagrelor (BRILINTA) 90 MG TABS tablet Take 1 tablet (90 mg total) by mouth 2 (two) times daily. 60 tablet 11   No current facility-administered medications for this visit.     No Known Allergies  Social History   Social History  . Marital status: Married    Spouse name: N/A  . Number of children: N/A  . Years of education: N/A   Occupational History  . Not on file.   Social History Main Topics  . Smoking status: Current Every Day Smoker    Packs/day: 2.00    Types: Cigarettes  . Smokeless tobacco: Not on file  . Alcohol use No  . Drug use: Unknown  . Sexual activity: Not on file   Other Topics Concern  . Not on file   Social History Narrative  . No narrative on file    Family History  Problem Relation Age of Onset  . Coronary artery disease Father   . Coronary artery disease Paternal Grandfather   . Heart disease Sister   . Heart failure Sister     Review of Systems:  As stated in the HPI and otherwise negative.   There were no vitals taken for this visit.  Physical Examination: General: Well developed, well nourished, NAD  HEENT: OP clear, mucus  membranes moist  SKIN: warm, dry. No rashes. Neuro: No focal deficits  Musculoskeletal: Muscle strength 5/5 all ext  Psychiatric: Mood and affect normal  Neck: No JVD, no carotid bruits, no thyromegaly, no lymphadenopathy.  Lungs:Clear bilaterally, no wheezes, rhonci, crackles Cardiovascular: Regular rate and rhythm. No murmurs, gallops or rubs. Abdomen:Soft. Bowel sounds present. Non-tender.  Extremities: No lower extremity edema. Pulses are 2 + in the bilateral DP/PT.  EKG:  EKG {ACTION; IS/IS ZOX:[id number]}OT:21021397} ordered today. The ekg ordered today demonstrates ***  Recent Labs: 04/29/2016: BUN <5; Creatinine, Ser 1.06; Hemoglobin 14.9; Platelets 191; Potassium 3.8; Sodium 139   Lipid Panel    Component Value Date/Time   CHOL 117  04/29/2016 0232   TRIG 131 04/29/2016 0232   HDL 21 (L) 04/29/2016 0232   CHOLHDL 5.6 04/29/2016 0232   VLDL 26 04/29/2016 0232   LDLCALC 70 04/29/2016 0232     Wt Readings from Last 3 Encounters:  08/05/16 157 lb 1.9 oz (71.3 kg)  05/07/16 157 lb (71.2 kg)  04/29/16 158 lb 8.2 oz (71.9 kg)     Other studies Reviewed: Additional studies/ records that were reviewed today include: ***. Review of the above records demonstrates: ***  Assessment and Plan:   1. CAD without angina: He has no chest pain suggestive of angina. He is s/p a NSTEMI June 2017 secondary to occlusion of the OM2. A drug eluting stent was placed. He is on ASA, Brilinta, statin and beta blocker.   2. Tobacco abuse: Smoking cessation advised.   3. HTN: BP is controlled.   4. HLD: He is on a statin. LDL is at goal.   Current medicines are reviewed at length with the patient today.  The patient {ACTIONS; HAS/DOES NOT HAVE:19233} concerns regarding medicines.  The following changes have been made:  {PLAN; NO CHANGE:13088:s}  Labs/ tests ordered today include: *** No orders of the defined types were placed in this encounter.   Disposition:   FU with *** in {gen number [id number]}0-10:310397} {TIME; UNITS DAY/WEEK/MONTH:19136}  Signed, Verne Carrowhristopher McAlhany, MD 10/26/2016 5:50 PM    [facility] [address], [place], [place]  [address] Phone: [phone](336) [phone]; Fax: [phone]) [phone]

## 2016-10-27 ENCOUNTER — Ambulatory Visit: Payer: BLUE CROSS/BLUE SHIELD | Admitting: Cardiovascular Disease

## 2016-10-27 ENCOUNTER — Telehealth: Payer: Self-pay | Admitting: Cardiovascular Disease

## 2016-10-27 NOTE — Telephone Encounter (Signed)
No show for appt today. cdm 

## 2016-11-02 ENCOUNTER — Encounter: Payer: Self-pay | Admitting: Cardiovascular Disease

## 2016-11-24 ENCOUNTER — Other Ambulatory Visit: Payer: Self-pay | Admitting: Student

## 2016-12-08 ENCOUNTER — Encounter: Payer: Self-pay | Admitting: Cardiovascular Disease

## 2016-12-08 ENCOUNTER — Ambulatory Visit (INDEPENDENT_AMBULATORY_CARE_PROVIDER_SITE_OTHER): Payer: BLUE CROSS/BLUE SHIELD | Admitting: Cardiovascular Disease

## 2016-12-08 VITALS — BP 130/90 | HR 66 | Ht 66.0 in | Wt 161.0 lb

## 2016-12-08 DIAGNOSIS — E7800 Pure hypercholesterolemia, unspecified: Secondary | ICD-10-CM | POA: Diagnosis not present

## 2016-12-08 DIAGNOSIS — I10 Essential (primary) hypertension: Secondary | ICD-10-CM

## 2016-12-08 DIAGNOSIS — K625 Hemorrhage of anus and rectum: Secondary | ICD-10-CM | POA: Diagnosis not present

## 2016-12-08 DIAGNOSIS — I2510 Atherosclerotic heart disease of native coronary artery without angina pectoris: Secondary | ICD-10-CM

## 2016-12-08 DIAGNOSIS — Z720 Tobacco use: Secondary | ICD-10-CM

## 2016-12-08 MED ORDER — ATORVASTATIN CALCIUM 80 MG PO TABS: ORAL_TABLET | ORAL | 3 refills | Status: DC

## 2016-12-08 MED ORDER — METOPROLOL TARTRATE 25 MG PO TABS: 12.5000 mg | ORAL_TABLET | Freq: Two times a day (BID) | ORAL | 3 refills | Status: DC

## 2016-12-08 NOTE — Patient Instructions (Signed)
Medication Instructions:  Your physician has recommended you make the following change in your medication: Stop Brilinta.   Labwork: CBC to be done today.  Testing/Procedures: none  Follow-Up: Your physician recommends that you schedule a follow-up appointment in: 3 months.     Any Other Special Instructions Will Be Listed Below (If Applicable).     If you need a refill on your cardiac medications before your next appointment, please call your pharmacy.

## 2016-12-08 NOTE — Progress Notes (Signed)
Chief Complaint  Patient presents with  . Coronary Artery Disease    History of Present Illness: 46 yo male with history of CAD, tobacco abuse here today for follow up. He was admitted to [facility] June 2017 with a NSTEMI. Cardiac cath June 2017 with occluded second obtuse marginal branch treated with a DES x 1. Also with 20% mid LAD stenosis, 40% distal LAD stenosis, 20% proximal RCA stenosis. Echo June 2017 with LVEF=50-55%, posterolateral wall hypokinesis, mild MR. He has done well following his MI. He has continued to smoke.   He is here today for follow up. He has rare chest pains or dyspnea. He drives a truck. He has been having a bloody bowel movement every morning for 2 months. He has a history of hemorrhoids. [facility].   Primary Care Physician: Egbert GaribaldiMillsaps, KIMBERLY M, NP  Past Medical History:  Diagnosis Date  . CAD (coronary artery disease)    a. 04/2016: NSTEMI and NSVT: Cath showing 100% stenosis 2nd Mrg (PCI w/ DES), 20% stenosis mid-LAD, 40% distal LAD, and 20% proxRCA   . Tobacco abuse   . Ventricular bigeminy    a. noted during 04/2016 admission. Started on BB.    Past Surgical History:  Procedure Laterality Date  . CARDIAC CATHETERIZATION N/A 04/28/2016   Procedure: Left Heart Cath and Coronary Angiography;  Surgeon: Kathleene Hazelhristopher D Haide Klinker, MD;  [facility];  Service: Cardiovascular;  Laterality: N/A;  . CARDIAC CATHETERIZATION N/A 04/28/2016   Procedure: Coronary Stent Intervention;  Surgeon: Kathleene Hazelhristopher D Immaculate Crutcher, MD;  Location: [facility] INVASIVE CV LAB;  Service: Cardiovascular;  Laterality: N/A;    Current Outpatient Prescriptions  Medication Sig Dispense Refill  . aspirin EC 81 MG EC tablet Take 1 tablet (81 mg total) by mouth daily.    Marland Kitchen. atorvastatin (LIPITOR) 80 MG tablet TAKE 1 TABLET BY MOUTH EVERY DAY AT 6 PM 30 tablet 0  . metoprolol tartrate (LOPRESSOR) 25 MG tablet Take 0.5 tablets (12.5 mg total) by mouth 2 (two) times  daily. 60 tablet 6  . nitroGLYCERIN (NITROSTAT) 0.4 MG SL tablet Place 1 tablet (0.4 mg total) under the tongue every 5 (five) minutes x 3 doses as needed for chest pain. 25 tablet 3  . ticagrelor (BRILINTA) 90 MG TABS tablet Take 1 tablet (90 mg total) by mouth 2 (two) times daily. 60 tablet 11   No current facility-administered medications for this visit.     No Known Allergies  Social History   Social History  . Marital status: Married    Spouse name: N/A  . Number of children: N/A  . Years of education: N/A   Occupational History  . Not on file.   Social History Main Topics  . Smoking status: Current Every Day Smoker    Packs/day: 2.00    Types: Cigarettes  . Smokeless tobacco: Never Used  . Alcohol use No  . Drug use: Unknown  . Sexual activity: Not on file   Other Topics Concern  . Not on file   Social History Narrative  . No narrative on file    Family History  Problem Relation Age of Onset  . Coronary artery disease Father   . Coronary artery disease Paternal Grandfather   . Heart disease Sister   . Heart failure Sister     Review of Systems:  As stated in the HPI and otherwise negative.   BP 130/90   Pulse 66   Ht  5\' 6"  (1.676 m)   Wt 161 lb (73 kg)   BMI 25.99 kg/m   Physical Examination: General: Well developed, well nourished, NAD  HEENT: OP clear, mucus membranes moist  SKIN: warm, dry. No rashes. Neuro: No focal deficits  Musculoskeletal: Muscle strength 5/5 all ext  Psychiatric: Mood and affect normal  Neck: No JVD, no carotid bruits, no thyromegaly, no lymphadenopathy.  Lungs:Clear bilaterally, no wheezes, rhonci, crackles Cardiovascular: Regular rate and rhythm. No murmurs, gallops or rubs. Abdomen:Soft. Bowel sounds present. Non-tender.  Extremities: No lower extremity edema. Pulses are 2 + in the bilateral DP/PT.  Echo June 2017: Left ventricle: Posterior lateral wall hypokinesis The cavity   size was normal. Wall thickness was  increased in a pattern of   mild LVH. Systolic function was normal. The estimated ejection   fraction was in the range of 50% to 55%. Left ventricular   diastolic function parameters were normal. - Mitral valve: There was mild regurgitation. - Atrial septum: No defect or patent foramen ovale was identified.  EKG:  EKG is not ordered today. The ekg ordered today demonstrates   Recent Labs: 04/29/2016: BUN <5; Creatinine, Ser 1.06; Hemoglobin 14.9; Platelets 191; Potassium 3.8; Sodium 139   Lipid Panel    Component Value Date/Time   CHOL 117 04/29/2016 0232   TRIG 131 04/29/2016 0232   HDL 21 (L) 04/29/2016 0232   CHOLHDL 5.6 04/29/2016 0232   VLDL 26 04/29/2016 0232   LDLCALC 70 04/29/2016 0232     Wt Readings from Last 3 Encounters:  12/08/16 161 lb (73 kg)  08/05/16 157 lb 1.9 oz (71.3 kg)  05/07/16 157 lb (71.2 kg)     Other studies Reviewed: Additional studies/ records that were reviewed today include: . Review of the above records demonstrates:   Assessment and Plan:   1. CAD without angina: No chest pain suggestive of angina. He is on ASA, Brilinta, statin and beta blocker. LV function normal by echo post MI. He is now having bloody bowel movements several times per day. He has a history of hemorrhoids but he is reporting abnormally large amounts of blood over the last few weeks. He cancelled his appt at [facility] in GI as he had to work several weeks ago. He is now 7 months out from his drug eluting stent placement. While not optimal to hold his Brilinta, I think it is necessary to hold for now while GI workup is being performed with ongoing GI bleeding. Continue ASA. Check CBC today. He will contact primary care and GI.   2. HTN: BP is controlled. No changes  3. Hyperlipidemia: Continue statin. LDL at goal.   4. Tobacco abuse: He is still smoking. Smoking cessation recommended.   5. Rectal bleeding: See above. Likely hemorrhoids but will need GI evaluation   Current  medicines are reviewed at length with the patient today.  The patient does not have concerns regarding medicines.  The following changes have been made:  no change  Labs/ tests ordered today include:  No orders of the defined types were placed in this encounter.  Disposition:   FU with me in 3 months  Signed, Verne Carrow, MD 12/08/2016 4:33 PM    [facility] [address] [place], [place], [place]  [address] Phone: [phone]; Fax: [phone]

## 2016-12-09 LAB — CBC WITH DIFFERENTIAL/PLATELET
BASOS ABS: 0.1 10*3/uL (ref 0.0–0.2)
Basos: 1 %
EOS (ABSOLUTE): 1.1 10*3/uL — ABNORMAL HIGH (ref 0.0–0.4)
EOS: 11 %
HEMATOCRIT: 45 % (ref 37.5–51.0)
Hemoglobin: 15.7 g/dL (ref 13.0–17.7)
Immature Grans (Abs): 0 10*3/uL (ref 0.0–0.1)
Immature Granulocytes: 0 %
LYMPHS ABS: 2.1 10*3/uL (ref 0.7–3.1)
Lymphs: 20 %
MCH: 34.6 pg — AB (ref 26.6–33.0)
MCHC: 34.9 g/dL (ref 31.5–35.7)
MCV: 99 fL — ABNORMAL HIGH (ref 79–97)
Monocytes Absolute: 1.4 10*3/uL — ABNORMAL HIGH (ref 0.1–0.9)
Monocytes: 14 %
NEUTROS ABS: 5.6 10*3/uL (ref 1.4–7.0)
Neutrophils: 54 %
Platelets: 231 10*3/uL (ref 150–379)
RBC: 4.54 x10E6/uL (ref 4.14–5.80)
RDW: 13 % (ref 12.3–15.4)
WBC: 10.2 10*3/uL (ref 3.4–10.8)

## 2017-02-23 ENCOUNTER — Telehealth: Payer: Self-pay | Admitting: Cardiovascular Disease

## 2017-02-23 NOTE — Telephone Encounter (Signed)
Pt and wife called concern because   since pt had a heart attack  he has been having problems having an erection. Pt would like to know if MD can prescribed a medication for this issue.

## 2017-02-23 NOTE — Telephone Encounter (Signed)
He can safely take ED medications.  I will be glad to write for Viagra 50 mg to use prn for erection with prescription for 5 pills and 11 refills. He can also consider using [facility] where he can get sildenafil 20 mg tablets and use 2--3 tabs as needed for an erection. Thayer Ohm

## 2017-02-23 NOTE — Telephone Encounter (Signed)
New Message:    Please call,pt have some questions and some concerns.

## 2017-02-24 MED ORDER — SILDENAFIL CITRATE 50 MG PO TABS: 50.0000 mg | ORAL_TABLET | Freq: Every day | ORAL | 11 refills | Status: DC | PRN

## 2017-02-24 NOTE — Telephone Encounter (Signed)
I placed call to call back number listed.  There was no answer.

## 2017-02-24 NOTE — Telephone Encounter (Signed)
I spoke with Carney Bern and told her I would send Viagra to pt's pharmacy--[facility] [address].  I explained to her how pt should take Viagra and also that he cannot take Viagra and NTG in the same 24 hour period.  She is aware if pt should have chest pain during the 24 hour time period he should go to ED. Pt is seeing Dr. Clifton Mataio next week and will let us know if Viagra is too expensive and change to sildenafil 20 mg tablets can be made at that time.

## 2017-02-24 NOTE — Telephone Encounter (Signed)
New Message ° ° pt wife verbalized that she is returning call for rn °

## 2017-03-04 ENCOUNTER — Encounter: Payer: Self-pay | Admitting: Cardiovascular Disease

## 2017-03-04 ENCOUNTER — Encounter (INDEPENDENT_AMBULATORY_CARE_PROVIDER_SITE_OTHER): Payer: Self-pay

## 2017-03-04 ENCOUNTER — Ambulatory Visit (INDEPENDENT_AMBULATORY_CARE_PROVIDER_SITE_OTHER): Payer: BLUE CROSS/BLUE SHIELD | Admitting: Cardiovascular Disease

## 2017-03-04 VITALS — BP 138/88 | HR 50 | Ht 66.0 in | Wt 166.0 lb

## 2017-03-04 DIAGNOSIS — Z720 Tobacco use: Secondary | ICD-10-CM

## 2017-03-04 DIAGNOSIS — I2510 Atherosclerotic heart disease of native coronary artery without angina pectoris: Secondary | ICD-10-CM

## 2017-03-04 DIAGNOSIS — I10 Essential (primary) hypertension: Secondary | ICD-10-CM

## 2017-03-04 DIAGNOSIS — E7800 Pure hypercholesterolemia, unspecified: Secondary | ICD-10-CM

## 2017-03-04 DIAGNOSIS — N528 Other male erectile dysfunction: Secondary | ICD-10-CM | POA: Diagnosis not present

## 2017-03-04 LAB — COMPREHENSIVE METABOLIC PANEL
ALBUMIN: 3.8 g/dL (ref 3.5–5.5)
ALK PHOS: 88 IU/L (ref 39–117)
ALT: 67 IU/L — AB (ref 0–44)
AST: 45 IU/L — ABNORMAL HIGH (ref 0–40)
Albumin/Globulin Ratio: 1.7 (ref 1.2–2.2)
BUN / CREAT RATIO: 10 (ref 9–20)
BUN: 11 mg/dL (ref 6–24)
Bilirubin Total: 0.4 mg/dL (ref 0.0–1.2)
CHLORIDE: 103 mmol/L (ref 96–106)
CO2: 24 mmol/L (ref 18–29)
Calcium: 8.6 mg/dL — ABNORMAL LOW (ref 8.7–10.2)
Creatinine, Ser: 1.07 mg/dL (ref 0.76–1.27)
GFR calc Af Amer: 94 mL/min/{1.73_m2} (ref >59)
GFR calc non Af Amer: 82 mL/min/{1.73_m2} (ref >59)
GLUCOSE: 81 mg/dL (ref 65–99)
Globulin, Total: 2.3 g/dL (ref 1.5–4.5)
Potassium: 4 mmol/L (ref 3.5–5.2)
Sodium: 141 mmol/L (ref 134–144)
Total Protein: 6.1 g/dL (ref 6.0–8.5)

## 2017-03-04 LAB — LIPID PANEL
CHOLESTEROL TOTAL: 81 mg/dL — AB (ref 100–199)
Chol/HDL Ratio: 3.5 ratio (ref 0.0–5.0)
HDL: 23 mg/dL — AB (ref >39)
LDL CALC: 32 mg/dL (ref 0–99)
Triglycerides: 132 mg/dL (ref 0–149)
VLDL CHOLESTEROL CAL: 26 mg/dL (ref 5–40)

## 2017-03-04 MED ORDER — NITROGLYCERIN 0.4 MG SL SUBL: 0.4000 mg | SUBLINGUAL_TABLET | SUBLINGUAL | 6 refills | Status: DC | PRN

## 2017-03-04 MED ORDER — SILDENAFIL CITRATE 50 MG PO TABS: 50.0000 mg | ORAL_TABLET | Freq: Every day | ORAL | 6 refills | Status: DC | PRN

## 2017-03-04 MED ORDER — ATORVASTATIN CALCIUM 80 MG PO TABS: ORAL_TABLET | ORAL | 3 refills | Status: DC

## 2017-03-04 MED ORDER — METOPROLOL TARTRATE 25 MG PO TABS: 12.5000 mg | ORAL_TABLET | Freq: Two times a day (BID) | ORAL | 3 refills | Status: DC

## 2017-03-04 NOTE — Progress Notes (Signed)
Chief Complaint  Patient presents with  . Follow-up    History of Present Illness: 46 yo male with history of CAD, tobacco abuse here today for follow up. He was admitted to [facility] June 2017 with a NSTEMI. Cardiac cath June 2017 with occluded second obtuse marginal branch treated with a DES x 1. Also with 20% mid LAD stenosis, 40% distal LAD stenosis, 20% proximal RCA stenosis. Echo June 2017 with LVEF=50-55%, posterolateral wall hypokinesis, mild MR. He has done well following his MI. He did have hemorrhoidal bleeding and Brilinta was stopped.   He is here today for follow up. The patient denies any chest pain, dyspnea, palpitations, lower extremity edema, orthopnea, PND, dizziness, near syncope or syncope. 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    Primary Care Physician: Imelda Pillow, NP  Past Medical History:  Diagnosis Date  . CAD (coronary artery disease)    a. 04/2016: NSTEMI and NSVT: Cath showing 100% stenosis 2nd Mrg (PCI w/ DES), 20% stenosis mid-LAD, 40% distal LAD, and 20% proxRCA   . Tobacco abuse   . Ventricular bigeminy    a. noted during 04/2016 admission. Started on BB.    Past Surgical History:  Procedure Laterality Date  . CARDIAC CATHETERIZATION N/A 04/28/2016   Procedure: Left Heart Cath and Coronary Angiography;  Surgeon: Burnell Blanks, MD;  Location: [facility] CV LAB;  Service: Cardiovascular;  Laterality: N/A;  . CARDIAC CATHETERIZATION N/A 04/28/2016   Procedure: Coronary Stent Intervention;  Surgeon: Burnell Blanks, MD;  Location: [facility];  Service: Cardiovascular;  Laterality: N/A;    Current Outpatient Prescriptions  Medication Sig Dispense Refill  . aspirin EC 81 MG EC tablet Take 1 tablet (81 mg total) by mouth daily.    Marland Kitchen atorvastatin (LIPITOR) 80 MG tablet TAKE 1 TABLET BY MOUTH EVERY DAY AT 6 PM 90 tablet 3  . metoprolol tartrate (LOPRESSOR) 25 MG tablet Take 0.5 tablets (12.5 mg total) by mouth 2  (two) times daily. 180 tablet 3  . nitroGLYCERIN (NITROSTAT) 0.4 MG SL tablet Place 1 tablet (0.4 mg total) under the tongue every 5 (five) minutes x 3 doses as needed for chest pain. 25 tablet 6  . sildenafil (VIAGRA) 50 MG tablet Take 1 tablet (50 mg total) by mouth daily as needed for erectile dysfunction. 15 tablet 6   No current facility-administered medications for this visit.     No Known Allergies  Social History   Social History  . Marital status: Married    Spouse name: N/A  . Number of children: N/A  . Years of education: N/A   Occupational History  . Not on file.   Social History Main Topics  . Smoking status: Current Every Day Smoker    Packs/day: 2.00    Types: Cigarettes  . Smokeless tobacco: Never Used  . Alcohol use No  . Drug use: Unknown  . Sexual activity: Not on file   Other Topics Concern  . Not on file   Social History Narrative  . No narrative on file    Family History  Problem Relation Age of Onset  . Coronary artery disease Father   . Coronary artery disease Paternal Grandfather   . Heart disease Sister   . Heart failure Sister     Review of Systems:  As stated in the HPI and otherwise negative.   BP 138/88   Pulse (!) 50   Ht '5\' 6"'$  (1.676 m)  Wt 166 lb (75.3 kg)   SpO2 98%   BMI 26.79 kg/m   Physical Examination:  General: Well developed, well nourished, NAD  HEENT: OP clear, mucus membranes moist  SKIN: warm, dry. No rashes. Neuro: No focal deficits  Musculoskeletal: Muscle strength 5/5 all ext  Psychiatric: Mood and affect normal  Neck: No JVD, no carotid bruits, no thyromegaly, no lymphadenopathy.  Lungs:Clear bilaterally, no wheezes, rhonci, crackles Cardiovascular: Regular rate and rhythm. No murmurs, gallops or rubs. Abdomen:Soft. Bowel sounds present. Non-tender.  Extremities: No lower extremity edema. Pulses are 2 + in the bilateral DP/PT.    Echo June 2017: Left ventricle: Posterior lateral wall hypokinesis  The cavity   size was normal. Wall thickness was increased in a pattern of   mild LVH. Systolic function was normal. The estimated ejection   fraction was in the range of 50% to 55%. Left ventricular   diastolic function parameters were normal. - Mitral valve: There was mild regurgitation. - Atrial septum: No defect or patent foramen ovale was identified.  EKG:  EKG is ordered today. The ekg ordered today demonstrates sinus, PVCs in pattern of bigeminy. Non-specific T wave abn.   Recent Labs: 04/29/2016: BUN <5; Creatinine, Ser 1.06; Hemoglobin 14.9; Potassium 3.8; Sodium 139 12/08/2016: Platelets 231   Lipid Panel    Component Value Date/Time   CHOL 117 04/29/2016 0232   TRIG 131 04/29/2016 0232   HDL 21 (L) 04/29/2016 0232   CHOLHDL 5.6 04/29/2016 0232   VLDL 26 04/29/2016 0232   LDLCALC 70 04/29/2016 0232     Wt Readings from Last 3 Encounters:  03/04/17 166 lb (75.3 kg)  12/08/16 161 lb (73 kg)  08/05/16 157 lb 1.9 oz (71.3 kg)     Other studies Reviewed: Additional studies/ records that were reviewed today include: . Review of the above records demonstrates:   Assessment and Plan:   1. CAD without angina: He is having no chest pain suggestive of angina. He is now on ASA, statin and beta blocker. Brilinta was stopped due to GI bleeding.  Check BMET  2. HTN: BP controlled. No changes today.   3. Hyperlipidemia: LDL at goal. Continue statin.  Repeat lipids and LFTs this am.  4. Tobacco abuse: Smoking cessation is encouraged.   5. Rectal bleeding: Due to hemorrhoids.   6. Erectile dysfunction: Continue Viagra  Current medicines are reviewed at length with the patient today.  The patient does not have concerns regarding medicines.  The following changes have been made:  no change  Labs/ tests ordered today include:   Orders Placed This Encounter  Procedures  . Comp Met (CMET)  . Lipid Profile  . EKG 12-Lead   Disposition:   FU with me in 12  months  Signed, Lauree Chandler, MD 03/04/2017 10:04 AM    [facility] [place], [place], [place]  [address] Phone: [phone]; Fax: [phone]

## 2017-03-04 NOTE — Patient Instructions (Signed)
Medication Instructions: . Your physician recommends that you continue on your current medications as directed. Please refer to the Current Medication list given to you today.   Labwork: Lab work to be done today--CMET and lipid profile  Testing/Procedures: none  Follow-Up: Your physician recommends that you schedule a follow-up appointment in: 12 months. Please call our office in about 9 months to schedule this appointment    Any Other Special Instructions Will Be Listed Below (If Applicable).     If you need a refill on your cardiac medications before your next appointment, please call your pharmacy.   

## 2017-03-07 ENCOUNTER — Other Ambulatory Visit: Payer: Self-pay | Admitting: *Deleted

## 2017-03-07 DIAGNOSIS — R7989 Other specified abnormal findings of blood chemistry: Secondary | ICD-10-CM

## 2017-03-07 DIAGNOSIS — R945 Abnormal results of liver function studies: Secondary | ICD-10-CM

## 2017-03-07 DIAGNOSIS — E7849 Other hyperlipidemia: Secondary | ICD-10-CM

## 2017-03-07 MED ORDER — ATORVASTATIN CALCIUM 40 MG PO TABS: ORAL_TABLET | ORAL | 3 refills | Status: DC

## 2017-04-05 ENCOUNTER — Encounter (INDEPENDENT_AMBULATORY_CARE_PROVIDER_SITE_OTHER): Payer: Self-pay

## 2017-04-05 ENCOUNTER — Other Ambulatory Visit: Payer: BLUE CROSS/BLUE SHIELD

## 2017-04-05 DIAGNOSIS — R945 Abnormal results of liver function studies: Secondary | ICD-10-CM

## 2017-04-05 DIAGNOSIS — E7849 Other hyperlipidemia: Secondary | ICD-10-CM

## 2017-04-05 DIAGNOSIS — R7989 Other specified abnormal findings of blood chemistry: Secondary | ICD-10-CM

## 2017-04-06 ENCOUNTER — Telehealth: Payer: Self-pay | Admitting: Cardiovascular Disease

## 2017-04-06 LAB — HEPATIC FUNCTION PANEL
ALT: 49 IU/L — AB (ref 0–44)
AST: 27 IU/L (ref 0–40)
Albumin: 4.2 g/dL (ref 3.5–5.5)
Alkaline Phosphatase: 93 IU/L (ref 39–117)
BILIRUBIN TOTAL: 0.6 mg/dL (ref 0.0–1.2)
Bilirubin, Direct: 0.16 mg/dL (ref 0.00–0.40)
Total Protein: 6.4 g/dL (ref 6.0–8.5)

## 2017-04-06 NOTE — Telephone Encounter (Signed)
I placed call to pt but voicemail not set up on call back number.  No answer on home number listed for pt

## 2017-04-06 NOTE — Telephone Encounter (Signed)
Patient returning call for results. Thanks!  

## 2017-04-06 NOTE — Telephone Encounter (Signed)
I spoke with Cala BradfordKimberly and reviewed liver profile results with her.

## 2017-05-08 DIAGNOSIS — T1491XA Suicide attempt, initial encounter: Secondary | ICD-10-CM

## 2017-05-08 HISTORY — DX: Suicide attempt, initial encounter: T14.91XA

## 2017-05-29 ENCOUNTER — Encounter (HOSPITAL_COMMUNITY): Payer: Self-pay | Admitting: *Deleted

## 2017-05-29 ENCOUNTER — Inpatient Hospital Stay (HOSPITAL_COMMUNITY)
Admission: EM | Admit: 2017-05-29 | Discharge: 2017-05-31 | DRG: 918 | Disposition: A | Discharge status: Other Acute Inpatient Hospital | Payer: BLUE CROSS/BLUE SHIELD | Attending: Internal Medicine | Admitting: Internal Medicine

## 2017-05-29 DIAGNOSIS — T40602A Poisoning by unspecified narcotics, intentional self-harm, initial encounter: Secondary | ICD-10-CM | POA: Diagnosis not present

## 2017-05-29 DIAGNOSIS — F10920 Alcohol use, unspecified with intoxication, uncomplicated: Secondary | ICD-10-CM

## 2017-05-29 DIAGNOSIS — Z9861 Coronary angioplasty status: Secondary | ICD-10-CM

## 2017-05-29 DIAGNOSIS — F17210 Nicotine dependence, cigarettes, uncomplicated: Secondary | ICD-10-CM | POA: Diagnosis present

## 2017-05-29 DIAGNOSIS — I959 Hypotension, unspecified: Secondary | ICD-10-CM | POA: Diagnosis present

## 2017-05-29 DIAGNOSIS — T402X2A Poisoning by other opioids, intentional self-harm, initial encounter: Secondary | ICD-10-CM | POA: Diagnosis not present

## 2017-05-29 DIAGNOSIS — G8929 Other chronic pain: Secondary | ICD-10-CM | POA: Diagnosis present

## 2017-05-29 DIAGNOSIS — T40601A Poisoning by unspecified narcotics, accidental (unintentional), initial encounter: Secondary | ICD-10-CM | POA: Diagnosis present

## 2017-05-29 DIAGNOSIS — E784 Other hyperlipidemia: Secondary | ICD-10-CM

## 2017-05-29 DIAGNOSIS — Z7982 Long term (current) use of aspirin: Secondary | ICD-10-CM

## 2017-05-29 DIAGNOSIS — E785 Hyperlipidemia, unspecified: Secondary | ICD-10-CM | POA: Diagnosis present

## 2017-05-29 DIAGNOSIS — I4729 Other ventricular tachycardia: Secondary | ICD-10-CM

## 2017-05-29 DIAGNOSIS — D72829 Elevated white blood cell count, unspecified: Secondary | ICD-10-CM | POA: Diagnosis present

## 2017-05-29 DIAGNOSIS — Z8249 Family history of ischemic heart disease and other diseases of the circulatory system: Secondary | ICD-10-CM

## 2017-05-29 DIAGNOSIS — R05 Cough: Secondary | ICD-10-CM

## 2017-05-29 DIAGNOSIS — R0689 Other abnormalities of breathing: Secondary | ICD-10-CM

## 2017-05-29 DIAGNOSIS — F10120 Alcohol abuse with intoxication, uncomplicated: Secondary | ICD-10-CM | POA: Diagnosis present

## 2017-05-29 DIAGNOSIS — J441 Chronic obstructive pulmonary disease with (acute) exacerbation: Secondary | ICD-10-CM | POA: Diagnosis present

## 2017-05-29 DIAGNOSIS — I2510 Atherosclerotic heart disease of native coronary artery without angina pectoris: Secondary | ICD-10-CM | POA: Diagnosis present

## 2017-05-29 DIAGNOSIS — I472 Ventricular tachycardia: Secondary | ICD-10-CM

## 2017-05-29 DIAGNOSIS — R059 Cough, unspecified: Secondary | ICD-10-CM

## 2017-05-29 DIAGNOSIS — I10 Essential (primary) hypertension: Secondary | ICD-10-CM | POA: Diagnosis present

## 2017-05-29 DIAGNOSIS — Z720 Tobacco use: Secondary | ICD-10-CM | POA: Diagnosis present

## 2017-05-29 DIAGNOSIS — R51 Headache: Secondary | ICD-10-CM | POA: Diagnosis present

## 2017-05-29 DIAGNOSIS — I252 Old myocardial infarction: Secondary | ICD-10-CM

## 2017-05-29 HISTORY — DX: Dorsalgia, unspecified: M54.9

## 2017-05-29 LAB — ETHANOL: Alcohol, Ethyl (B): 160 mg/dL — ABNORMAL HIGH (ref <5)

## 2017-05-29 LAB — CBC
HCT: 45.2 % (ref 39.0–52.0)
Hemoglobin: 15.9 g/dL (ref 13.0–17.0)
MCH: 34.9 pg — ABNORMAL HIGH (ref 26.0–34.0)
MCHC: 35.2 g/dL (ref 30.0–36.0)
MCV: 99.1 fL (ref 78.0–100.0)
PLATELETS: 158 10*3/uL (ref 150–400)
RBC: 4.56 MIL/uL (ref 4.22–5.81)
RDW: 13.3 % (ref 11.5–15.5)
WBC: 13.2 10*3/uL — AB (ref 4.0–10.5)

## 2017-05-29 LAB — COMPREHENSIVE METABOLIC PANEL
ALT: 66 U/L — AB (ref 17–63)
AST: 35 U/L (ref 15–41)
Albumin: 3.8 g/dL (ref 3.5–5.0)
Alkaline Phosphatase: 66 U/L (ref 38–126)
Anion gap: 8 (ref 5–15)
BUN: 16 mg/dL (ref 6–20)
CHLORIDE: 107 mmol/L (ref 101–111)
CO2: 27 mmol/L (ref 22–32)
CREATININE: 1.54 mg/dL — AB (ref 0.61–1.24)
Calcium: 8.7 mg/dL — ABNORMAL LOW (ref 8.9–10.3)
GFR calc Af Amer: 60 mL/min — ABNORMAL LOW (ref >60)
GFR calc non Af Amer: 52 mL/min — ABNORMAL LOW (ref >60)
Glucose, Bld: 140 mg/dL — ABNORMAL HIGH (ref 65–99)
Potassium: 3.5 mmol/L (ref 3.5–5.1)
SODIUM: 142 mmol/L (ref 135–145)
Total Bilirubin: 0.6 mg/dL (ref 0.3–1.2)
Total Protein: 6.8 g/dL (ref 6.5–8.1)

## 2017-05-29 LAB — RAPID URINE DRUG SCREEN, HOSP PERFORMED
Amphetamines: NOT DETECTED
BARBITURATES: NOT DETECTED
BENZODIAZEPINES: NOT DETECTED
COCAINE: NOT DETECTED
OPIATES: POSITIVE — AB
Tetrahydrocannabinol: NOT DETECTED

## 2017-05-29 LAB — SALICYLATE LEVEL: Salicylate Lvl: <7 mg/dL (ref 2.8–30.0)

## 2017-05-29 LAB — TROPONIN I: Troponin I: <0.03 ng/mL (ref <0.03)

## 2017-05-29 LAB — TSH: TSH: 2.12 u[IU]/mL (ref 0.350–4.500)

## 2017-05-29 LAB — ACETAMINOPHEN LEVEL: Acetaminophen (Tylenol), Serum: <10 ug/mL — ABNORMAL LOW (ref 10–30)

## 2017-05-29 MED ORDER — DEXTROSE-NACL 5-0.9 % IV SOLN
INTRAVENOUS | Status: DC
  Administered 2017-05-29 – 2017-05-30 (×5): via INTRAVENOUS

## 2017-05-29 MED ORDER — ONDANSETRON HCL 4 MG PO TABS: 4.0000 mg | ORAL_TABLET | Freq: Four times a day (QID) | ORAL | Status: DC | PRN

## 2017-05-29 MED ORDER — THIAMINE HCL 100 MG/ML IJ SOLN
Freq: Once | INTRAMUSCULAR | Status: AC
Start: 2017-05-29 — End: 2017-05-29
  Administered 2017-05-29: 09:00:00 via INTRAVENOUS
  Filled 2017-05-29 (×2): qty 1000

## 2017-05-29 MED ORDER — SODIUM CHLORIDE 0.9% FLUSH
3.0000 mL | Freq: Two times a day (BID) | INTRAVENOUS | Status: DC
  Administered 2017-05-29 – 2017-05-31 (×4): 3 mL via INTRAVENOUS

## 2017-05-29 MED ORDER — NALOXONE HCL 0.4 MG/ML IJ SOLN
1.0000 mg | Freq: Once | INTRAMUSCULAR | Status: AC
  Administered 2017-05-29: 1 mg via INTRAVENOUS

## 2017-05-29 MED ORDER — NALOXONE HCL 2 MG/2ML IJ SOSY
1.0000 mg/h | PREFILLED_SYRINGE | INTRAVENOUS | Status: DC
  Administered 2017-05-29: 1 mg/h via INTRAVENOUS
  Filled 2017-05-29 (×3): qty 4

## 2017-05-29 MED ORDER — NALOXONE HCL 2 MG/2ML IJ SOSY
PREFILLED_SYRINGE | INTRAMUSCULAR | Status: AC
  Filled 2017-05-29: qty 2

## 2017-05-29 MED ORDER — SODIUM CHLORIDE 0.9 % IV BOLUS (SEPSIS)
1000.0000 mL | Freq: Once | INTRAVENOUS | Status: AC
  Administered 2017-05-29: 1000 mL via INTRAVENOUS

## 2017-05-29 MED ORDER — NALOXONE HCL 2 MG/2ML IJ SOSY
PREFILLED_SYRINGE | INTRAMUSCULAR | Status: AC
  Filled 2017-05-29: qty 4

## 2017-05-29 MED ORDER — ENOXAPARIN SODIUM 40 MG/0.4ML ~~LOC~~ SOLN
40.0000 mg | SUBCUTANEOUS | Status: DC
  Administered 2017-05-29 – 2017-05-30 (×2): 40 mg via SUBCUTANEOUS
  Filled 2017-05-29 (×2): qty 0.4

## 2017-05-29 MED ORDER — ONDANSETRON HCL 4 MG/2ML IJ SOLN
4.0000 mg | Freq: Four times a day (QID) | INTRAMUSCULAR | Status: DC | PRN
  Administered 2017-05-30: 4 mg via INTRAVENOUS
  Filled 2017-05-29: qty 2

## 2017-05-29 NOTE — ED Notes (Signed)
Person called identifying as "his fiance"   Message taken for pt to call   Cala BradfordKimberly [phone]

## 2017-05-29 NOTE — ED Provider Notes (Signed)
[facility]Y DEPT Provider Note   CSN: [id number] Arrival date & time: 05/29/17  0321   Time seen 03:23 AM  History   Chief Complaint Chief Complaint  Patient presents with  . V70.1    HPI Caleb Grant is a 46 y.o. male.  HPI    patient was brought in by EMS after taking an overdose of his prescription pain medication. Patient states he was fighting with his fiance over money matters. He states they have been very stressed. He states he is "tired of arguing". He states in front of his fiance he took 58 of his oxycodone 10 mg tablets. He states when he took them he "wanted to leave this earth". He acknowledges that means he wanted to die. He will not answer if he still feels that way. He states "I don't know". EMS was called when patient became unresponsive. They report he had very shallow breathing and was only breathing about 6 times a minute. It took 2 mg of Narcan for the patient to be awakened. He states now he feels bad and states he has a diffuse headache. He states he's never seen a psychiatrist before or been treated for depression. He's never been admitted to psychiatric hospital. He states the oxycodone pills are his and he takes them for chronic back pain.  PCP Marva Panda, NP Orthopedics Dr Charlett Blake prescribes his oxycodone  Past Medical History:  Diagnosis Date  . Back pain   . CAD (coronary artery disease)    a. 04/2016: NSTEMI and NSVT: Cath showing 100% stenosis 2nd Mrg (PCI w/ DES), 20% stenosis mid-LAD, 40% distal LAD, and 20% proxRCA   . Tobacco abuse   . Ventricular bigeminy    a. noted during 04/2016 admission. Started on BB.    Patient Active Problem List   Diagnosis Date Noted  . CAD (coronary artery disease), native coronary artery- s/p PCI + DES to 2nd Mrg 04/2016, nl LVEF 50-55% 08/05/2016  . HLD (hyperlipidemia) 08/05/2016  . HTN (hypertension) 08/05/2016  . NSTEMI (non-ST elevated myocardial infarction) (HCC) 04/28/2016  . Tobacco abuse     . NSVT (nonsustained ventricular tachycardia) (HCC)     Past Surgical History:  Procedure Laterality Date  . CARDIAC CATHETERIZATION N/A 04/28/2016   Procedure: Left Heart Cath and Coronary Angiography;  Surgeon: Kathleene Hazel, MD;  Location: [facility] INVASIVE CV LAB;  Service: Cardiovascular;  Laterality: N/A;  . CARDIAC CATHETERIZATION N/A 04/28/2016   Procedure: Coronary Stent Intervention;  Surgeon: Kathleene Hazel, MD;  Location: [facility] INVASIVE CV LAB;  Service: Cardiovascular;  Laterality: N/A;       Home Medications    Prior to Admission medications   Medication Sig Start Date End Date Taking? Authorizing Provider  aspirin EC 81 MG EC tablet Take 1 tablet (81 mg total) by mouth daily. 04/30/16  Yes Strader, [place] M, PA-C  atorvastatin (LIPITOR) 40 MG tablet TAKE 1 TABLET BY MOUTH EVERY DAY 03/07/17  Yes Kathleene Hazel, MD  metoprolol tartrate (LOPRESSOR) 25 MG tablet Take 0.5 tablets (12.5 mg total) by mouth 2 (two) times daily. 03/04/17  Yes Kathleene Hazel, MD  nitroGLYCERIN (NITROSTAT) 0.4 MG SL tablet Place 1 tablet (0.4 mg total) under the tongue every 5 (five) minutes x 3 doses as needed for chest pain. 03/04/17  Yes Kathleene Hazel, MD  sildenafil (VIAGRA) 50 MG tablet Take 1 tablet (50 mg total) by mouth daily as needed for erectile dysfunction. 03/04/17  Yes Kathleene Hazel, MD  Family History Family History  Problem Relation Age of Onset  . Coronary artery disease Father   . Coronary artery disease Paternal Grandfather   . Heart disease Sister   . Heart failure Sister     Social History Social History  Substance Use Topics  . Smoking status: Current Every Day Smoker    Packs/day: 2.00    Types: Cigarettes  . Smokeless tobacco: Never Used  . Alcohol use No  employed delivering cars to dealerships occasional ETOH Smokes 1 ppd   Allergies   Patient has no known allergies.   Review of Systems Review of Systems   All other systems reviewed and are negative.    Physical Exam ED Triage Vitals  Enc Vitals Group     BP 05/29/17 0322 (!) 155/102     Pulse Rate 05/29/17 0322 (!) 112     Resp 05/29/17 0322 20     Temp 05/29/17 0322 97.9 F (36.6 C)     Temp Source 05/29/17 0322 Oral     SpO2 05/29/17 0322 98 %     Weight 05/29/17 0322 166 lb (75.3 kg)     Height 05/29/17 0322 5\' 7"  (1.702 m)     Head Circumference --      Peak Flow --      Pain Score 05/29/17 0321 0     Pain Loc --      Pain Edu? --      Excl. in GC? --     Vital signs normal except for hypertension    Physical Exam  Constitutional: He is oriented to person, place, and time. He appears well-developed and well-nourished.  Non-toxic appearance. He does not appear ill. No distress.  HENT:  Head: Normocephalic and atraumatic.  Right Ear: External ear normal.  Left Ear: External ear normal.  Nose: Nose normal. No mucosal edema or rhinorrhea.  Mouth/Throat: Oropharynx is clear and moist and mucous membranes are normal. No dental abscesses or uvula swelling.  Eyes: Pupils are equal, round, and reactive to light. Conjunctivae and EOM are normal.  Pupils pinpoint  Neck: Normal range of motion and full passive range of motion without pain. Neck supple.  Cardiovascular: Normal rate, regular rhythm and normal heart sounds.  Exam reveals no gallop and no friction rub.   No murmur heard. Pulmonary/Chest: Effort normal and breath sounds normal. No respiratory distress. He has no wheezes. He has no rhonchi. He has no rales. He exhibits no tenderness and no crepitus.  Abdominal: Soft. Normal appearance and bowel sounds are normal. He exhibits no distension. There is no tenderness. There is no rebound and no guarding.  Musculoskeletal: Normal range of motion. He exhibits no edema or tenderness.  Moves all extremities well.   Neurological: He is alert and oriented to person, place, and time. He has normal strength. No cranial nerve  deficit.  Skin: Skin is warm, dry and intact. No rash noted. No erythema. No pallor.  Psychiatric: His speech is normal. He is slowed.  Flat affect, poor eye contact  Nursing note and vitals reviewed.    ED Treatments / Results  Labs (all labs ordered are listed, but only abnormal results are displayed) Results for orders placed or performed during the hospital encounter of 05/29/17  Comprehensive metabolic panel  Result Value Ref Range   Sodium 142 135 - 145 mmol/L   Potassium 3.5 3.5 - 5.1 mmol/L   Chloride 107 101 - 111 mmol/L   CO2 27 22 - 32 mmol/L  Glucose, Bld 140 (H) 65 - 99 mg/dL   BUN 16 6 - 20 mg/dL   Creatinine, Ser 1.611.54 (H) 0.61 - 1.24 mg/dL   Calcium 8.7 (L) 8.9 - 10.3 mg/dL   Total Protein 6.8 6.5 - 8.1 g/dL   Albumin 3.8 3.5 - 5.0 g/dL   AST 35 15 - 41 U/L   ALT 66 (H) 17 - 63 U/L   Alkaline Phosphatase 66 38 - 126 U/L   Total Bilirubin 0.6 0.3 - 1.2 mg/dL   GFR calc non Af Amer 52 (L) >60 mL/min   GFR calc Af Amer 60 (L) >60 mL/min   Anion gap 8 5 - 15  Ethanol  Result Value Ref Range   Alcohol, Ethyl (B) 160 (H) <5 mg/dL  Salicylate level  Result Value Ref Range   Salicylate Lvl <7.0 2.8 - 30.0 mg/dL  Acetaminophen level  Result Value Ref Range   Acetaminophen (Tylenol), Serum <10 (L) 10 - 30 ug/mL  cbc  Result Value Ref Range   WBC 13.2 (H) 4.0 - 10.5 K/uL   RBC 4.56 4.22 - 5.81 MIL/uL   Hemoglobin 15.9 13.0 - 17.0 g/dL   HCT 09.645.2 04.539.0 - 40.952.0 %   MCV 99.1 78.0 - 100.0 fL   MCH 34.9 (H) 26.0 - 34.0 pg   MCHC 35.2 30.0 - 36.0 g/dL   RDW 81.113.3 91.411.5 - 78.215.5 %   Platelets 158 150 - 400 K/uL  Troponin I  Result Value Ref Range   Troponin I <0.03 <0.03 ng/mL   Laboratory interpretation all normal except Alcohol intoxication, leukocytosis    EKG  EKG Interpretation  Date/Time:  Sunday May 29 2017 03:21:58 EDT Ventricular Rate:  104 PR Interval:    QRS Duration: 92 QT Interval:  342 QTC Calculation: 450 R Axis:   91 Text Interpretation:   Sinus arrhythmia Multiple premature complexes, vent & supraven Aberrant complex Probable left atrial enlargement Borderline right axis deviation Borderline T wave abnormalities Since last tracing rate faster Confirmed by Devoria AlbeKnapp, Marquett Bertoli ([id number]) on 05/29/2017 3:32:29 AM       Radiology No results found.  Procedures Procedures (including critical care time)  CRITICAL CARE Performed by: Kiela Shisler L Jamiah Homeyer Total critical care time: 39 minutes Critical care time was exclusive of separately billable procedures and treating other patients. Critical care was necessary to treat or prevent imminent or life-threatening deterioration. Critical care was time spent personally by me on the following activities: development of treatment plan with patient and/or surrogate as well as nursing, discussions with consultants, evaluation of patient's response to treatment, examination of patient, obtaining history from patient or surrogate, ordering and performing treatments and interventions, ordering and review of laboratory studies, ordering and review of radiographic studies, pulse oximetry and re-evaluation of patient's condition.   Medications Ordered in ED Medications  naloxone (NARCAN) 4 mg in dextrose 5 % 250 mL infusion (1 mg/hr Intravenous New Bag/Given 05/29/17 0432)  naloxone [facility](NARCAN) 2 MG/2ML injection (not administered)  naloxone [facility](NARCAN) injection 1 mg (1 mg Intravenous Given 05/29/17 0344)  sodium chloride 0.9 % bolus 1,000 mL (1,000 mLs Intravenous New Bag/Given 05/29/17 0511)     Initial Impression / Assessment and Plan / ED Course  I have reviewed the triage vital signs and the nursing notes.  Pertinent labs & imaging results that were available during my care of the patient were reviewed by me and considered in my medical decision making (see chart for details).  Patient was placed on a cardiac  monitor and had a sitter. He will be monitored to make sure he doesn't need more Narcan. Laboratory testing  was ordered.  03:41 AM patient is dropping his pulse ox into the upper 80's, he was given narcan 1 mg IV and started on a narcan drip  0:50 AM patient is arousable to verbal stimulus  Daughter came to the room and states the overdose happened possibly between 1:30 and 2 AM.  Patient is maintained on the Narcan drip. He was placed on a PCO2 monitor.  5 AM patient became mildly hypotensive in the low 90s. He was given a fluid bolus.  5:30 AM I talked to poison control again, they state if he's on the Narcan drip admission would be advisable. I will talk to the hospitalist. Blood pressure 111/75, heart rate 96, PCO2 47, PaO2 99%  05:45 AM Dr Conley Rolls, hospitalist, will admit  Final Clinical Impressions(s) / ED Diagnoses   Final diagnoses:  Narcotic overdose, intentional self-harm, initial encounter (HCC)  Hypotension, unspecified hypotension type  Hypoventilation  Alcoholic intoxication without complication [facility])    Plan admission  Devoria Albe, MD, Concha Pyo, MD 05/29/17 [phone]

## 2017-05-29 NOTE — ED Notes (Signed)
Pt awake but lethargic  RN, "Were you trying to kill yourself last night?"  Pt, "I guess"

## 2017-05-29 NOTE — ED Triage Notes (Signed)
Pt was found by family after taking 40 oxycodone 10 mg tabs tonight, per ems pt had decreased respirations, altered mental status, was given 2 mg narcan with improvement in symptoms, upon arrival to er, pt alert, able to answer questions, states " I took them all to kill myself to show my old lady"

## 2017-05-29 NOTE — ED Notes (Signed)
Pt continues to sleep. VS stable.

## 2017-05-29 NOTE — ED Notes (Signed)
PCO2 monitor applied to patient.

## 2017-05-29 NOTE — ED Notes (Signed)
[place], [place]

## 2017-05-29 NOTE — H&P (Signed)
History and Physical    Caleb Grant WUJ:[id number] DOB: 17-Jul-1971 DOA: 05/29/2017  PCP: Marva Panda, NP  Patient coming from: Home.    Chief Complaint:   Intentional overdose on narcotics.  HPI: Caleb Grant is an 46 y.o. male with hx of HTN, CAD, hx of PSVT, depression and chronic back pain on narcotics, was arguing with his fiance, told her he made an suicidal attempt by taking a whole bottle of Percocets ( ? 40 tablets), and became lethargic and unresponsive.  EMS was summoned, found him in respiratory failure, responded to Narcan.  Further evaluation included normal LFTs, and Cr of 1.5. He also has alcohol level of 160, and negative tylenol and ASA level.  He wasn't responding, but maintained his airways adequately and maintained his Sat.  He subsequently droppe his sat again, so he was given another narcan, and placed on a drip.  Hospitalist was asked to admit him for intentional narcotic overdose (suicidal attempt)  ED Course:  See above.  Rewiew of Systems: Unable.   Past Medical History:  Diagnosis Date  . Back pain   . CAD (coronary artery disease)    a. 04/2016: NSTEMI and NSVT: Cath showing 100% stenosis 2nd Mrg (PCI w/ DES), 20% stenosis mid-LAD, 40% distal LAD, and 20% proxRCA   . Tobacco abuse   . Ventricular bigeminy    a. noted during 04/2016 admission. Started on BB.   Past Surgical History:  Procedure Laterality Date  . CARDIAC CATHETERIZATION N/A 04/28/2016   Procedure: Left Heart Cath and Coronary Angiography;  Surgeon: Kathleene Hazel, MD;  Location: [facility] INVASIVE CV LAB;  Service: Cardiovascular;  Laterality: N/A;  . CARDIAC CATHETERIZATION N/A 04/28/2016   Procedure: Coronary Stent Intervention;  Surgeon: Kathleene Hazel, MD;  Location: [facility] INVASIVE CV LAB;  Service: Cardiovascular;  Laterality: N/A;     reports that he has been smoking Cigarettes.  He has been smoking about 2.00 packs per day. He has never used smokeless tobacco. He  reports that he does not drink alcohol. His drug history is not on file.  No Known Allergies  Family History  Problem Relation Age of Onset  . Coronary artery disease Father   . Coronary artery disease Paternal Grandfather   . Heart disease Sister   . Heart failure Sister      Prior to Admission medications   Medication Sig Start Date End Date Taking? Authorizing Provider  aspirin EC 81 MG EC tablet Take 1 tablet (81 mg total) by mouth daily. 04/30/16  Yes Strader, [place] M, PA-C  atorvastatin (LIPITOR) 40 MG tablet TAKE 1 TABLET BY MOUTH EVERY DAY 03/07/17  Yes Kathleene Hazel, MD  metoprolol tartrate (LOPRESSOR) 25 MG tablet Take 0.5 tablets (12.5 mg total) by mouth 2 (two) times daily. 03/04/17  Yes Kathleene Hazel, MD  nitroGLYCERIN (NITROSTAT) 0.4 MG SL tablet Place 1 tablet (0.4 mg total) under the tongue every 5 (five) minutes x 3 doses as needed for chest pain. 03/04/17  Yes Kathleene Hazel, MD  sildenafil (VIAGRA) 50 MG tablet Take 1 tablet (50 mg total) by mouth daily as needed for erectile dysfunction. 03/04/17  Yes Kathleene Hazel, MD    Physical Exam: Vitals:   05/29/17 0455 05/29/17 0500 05/29/17 0515 05/29/17 0530  BP:  91/74 105/68 111/75  Pulse: 89 87 84 89  Resp: 15 17 (!) 23 (!) 24  Temp:      TempSrc:      SpO2: 97%  97% 96% 99%  Weight:      Height:       Constitutional: NAD, calm, comfortable Vitals:   05/29/17 0455 05/29/17 0500 05/29/17 0515 05/29/17 0530  BP:  91/74 105/68 111/75  Pulse: 89 87 84 89  Resp: 15 17 (!) 23 (!) 24  Temp:      TempSrc:      SpO2: 97% 97% 96% 99%  Weight:      Height:       Eyes: small, slightly reactive.  ENMT: Mucous membranes are moist. Posterior pharynx clear of any exudate or lesions.Normal dentition.  Neck: normal, supple, no masses, no thyromegaly Respiratory: clear to auscultation bilaterally, no wheezing, no crackles. Normal respiratory effort. No accessory muscle use.    Cardiovascular: Regular rate and rhythm, no murmurs / rubs / gallops. No extremity edema. 2+ pedal pulses. No carotid bruits.  Abdomen: no tenderness, no masses palpated. No hepatosplenomegaly. Bowel sounds positive.  Musculoskeletal: no clubbing / cyanosis. No joint deformity upper and lower extremities. Good ROM, no contractures. Normal muscle tone.  Skin: no rashes, lesions, ulcers. No induration Neurologic: not responsive.  Barbinski is upgoing bilaterally.  Psychiatric: Unable to evaluate further.   Labs on Admission: I have personally reviewed following labs and imaging studies CBC:  Recent Labs Lab 05/29/17 0335  WBC 13.2*  HGB 15.9  HCT 45.2  MCV 99.1  PLT 158   Basic Metabolic Panel:  Recent Labs Lab 05/29/17 0335  NA 142  K 3.5  CL 107  CO2 27  GLUCOSE 140*  BUN 16  CREATININE 1.54*  CALCIUM 8.7*   GFR: Estimated Creatinine Clearance: 54.8 mL/min (A) (by C-G formula based on SCr of 1.54 mg/dL (H)). Liver Function Tests:  Recent Labs Lab 05/29/17 0335  AST 35  ALT 66*  ALKPHOS 66  BILITOT 0.6  PROT 6.8  ALBUMIN 3.8   Cardiac Enzymes:  Recent Labs Lab 05/29/17 0336  TROPONINI <0.03   Assessment/Plan Principal Problem:   Intentional opiate overdose (HCC) Active Problems:   Tobacco abuse   NSVT (nonsustained ventricular tachycardia) (HCC)   CAD (coronary artery disease), native coronary artery- s/p PCI + DES to 2nd Mrg 04/2016, nl LVEF 50-55%   HLD (hyperlipidemia)   HTN (hypertension)   Narcotic overdose    PLAN:   Suicidal attempt by intentional overdose of Narcotics:  Will admit him to SDU.  Continue with IV Narcan drip.  He is also intoxicated as well.  When he is awake, he will need TTS evaluation.    He is able to to maintain his airway and respiratory rate with good oxygen saturation.  CAD:  Stable.  Continue with meds once awake.  Alcohol abuse:  Will give IV Banana bag.    DVT prophylaxis: subQ heparin.  Code Status: FULL  CODE.  Family Communication: None.  Disposition Plan: TBD.  Consults called: None.  Admission status: inpatient.    Rikki Trosper MD FACP. [facility]  If 7PM-7AM, please contact night-coverage [email or web address] Password [email or web address]  05/29/2017, 6:01 AM

## 2017-05-29 NOTE — Progress Notes (Signed)
Patient is seen and examined. Please see today's H&P for the details. 46 y/o male with PMH of HTN, PSVT, Depression, chronic pain is admitted with opioid overdose, alcohol intoxication, suicide attempt. He is awake, alert, oriented, exam is unremarkable, clinically stable. Cont close monitoring, narcan, iv fluids. Bedside sitter, suicidal precautions.   Esperanza SheetsBURIEV, Shayla Heming N

## 2017-05-29 NOTE — ED Notes (Signed)
[  pt pulse ox dropped to 86% on RA, pt stimulated, placed on oxygen, Dr Lynelle DoctorKnapp notified, additional orders given,

## 2017-05-29 NOTE — ED Notes (Signed)
Daughter at the bedside

## 2017-05-29 NOTE — ED Notes (Signed)
[facility] [employer] who advised to monitor pt for tylenol level and resp depression,

## 2017-05-30 ENCOUNTER — Encounter (HOSPITAL_COMMUNITY): Payer: Self-pay | Admitting: Emergency Medicine

## 2017-05-30 DIAGNOSIS — F10920 Alcohol use, unspecified with intoxication, uncomplicated: Secondary | ICD-10-CM | POA: Diagnosis not present

## 2017-05-30 DIAGNOSIS — T40601A Poisoning by unspecified narcotics, accidental (unintentional), initial encounter: Secondary | ICD-10-CM | POA: Diagnosis not present

## 2017-05-30 DIAGNOSIS — T40602D Poisoning by unspecified narcotics, intentional self-harm, subsequent encounter: Secondary | ICD-10-CM | POA: Diagnosis not present

## 2017-05-30 DIAGNOSIS — T40602A Poisoning by unspecified narcotics, intentional self-harm, initial encounter: Secondary | ICD-10-CM

## 2017-05-30 DIAGNOSIS — I959 Hypotension, unspecified: Secondary | ICD-10-CM

## 2017-05-30 DIAGNOSIS — I2510 Atherosclerotic heart disease of native coronary artery without angina pectoris: Secondary | ICD-10-CM | POA: Diagnosis not present

## 2017-05-30 DIAGNOSIS — I10 Essential (primary) hypertension: Secondary | ICD-10-CM | POA: Diagnosis not present

## 2017-05-30 LAB — COMPREHENSIVE METABOLIC PANEL
ALT: 40 U/L (ref 17–63)
AST: 25 U/L (ref 15–41)
Albumin: 3.1 g/dL — ABNORMAL LOW (ref 3.5–5.0)
Alkaline Phosphatase: 65 U/L (ref 38–126)
Anion gap: 5 (ref 5–15)
BUN: 9 mg/dL (ref 6–20)
CO2: 26 mmol/L (ref 22–32)
Calcium: 7.9 mg/dL — ABNORMAL LOW (ref 8.9–10.3)
Chloride: 107 mmol/L (ref 101–111)
Creatinine, Ser: 1.18 mg/dL (ref 0.61–1.24)
GFR calc Af Amer: >60 mL/min (ref >60)
GFR calc non Af Amer: >60 mL/min (ref >60)
Glucose, Bld: 144 mg/dL — ABNORMAL HIGH (ref 65–99)
Potassium: 4 mmol/L (ref 3.5–5.1)
Sodium: 138 mmol/L (ref 135–145)
Total Bilirubin: 0.9 mg/dL (ref 0.3–1.2)
Total Protein: 5.7 g/dL — ABNORMAL LOW (ref 6.5–8.1)

## 2017-05-30 LAB — MRSA PCR SCREENING: MRSA by PCR: NEGATIVE

## 2017-05-30 LAB — HIV ANTIBODY (ROUTINE TESTING W REFLEX): HIV SCREEN 4TH GENERATION: NONREACTIVE

## 2017-05-30 MED ORDER — ADULT MULTIVITAMIN W/MINERALS CH
1.0000 | ORAL_TABLET | Freq: Every day | ORAL | Status: DC
  Administered 2017-05-30 – 2017-05-31 (×2): 1 via ORAL
  Filled 2017-05-30 (×2): qty 1

## 2017-05-30 MED ORDER — THIAMINE HCL 100 MG/ML IJ SOLN
100.0000 mg | Freq: Every day | INTRAMUSCULAR | Status: DC
  Filled 2017-05-30: qty 2

## 2017-05-30 MED ORDER — METOPROLOL TARTRATE 25 MG PO TABS
12.5000 mg | ORAL_TABLET | Freq: Two times a day (BID) | ORAL | Status: DC
  Administered 2017-05-30 – 2017-05-31 (×3): 12.5 mg via ORAL
  Filled 2017-05-30 (×3): qty 1

## 2017-05-30 MED ORDER — PREDNISONE 20 MG PO TABS
30.0000 mg | ORAL_TABLET | Freq: Every day | ORAL | Status: DC
  Administered 2017-05-30 – 2017-05-31 (×2): 30 mg via ORAL
  Filled 2017-05-30 (×2): qty 1

## 2017-05-30 MED ORDER — VITAMIN B-1 100 MG PO TABS
100.0000 mg | ORAL_TABLET | Freq: Every day | ORAL | Status: DC
  Administered 2017-05-30 – 2017-05-31 (×2): 100 mg via ORAL
  Filled 2017-05-30 (×2): qty 1

## 2017-05-30 MED ORDER — ATORVASTATIN CALCIUM 40 MG PO TABS
40.0000 mg | ORAL_TABLET | Freq: Every day | ORAL | Status: DC
  Administered 2017-05-30 – 2017-05-31 (×2): 40 mg via ORAL
  Filled 2017-05-30 (×2): qty 1

## 2017-05-30 MED ORDER — ASPIRIN EC 81 MG PO TBEC
81.0000 mg | DELAYED_RELEASE_TABLET | Freq: Every day | ORAL | Status: DC
  Administered 2017-05-30 – 2017-05-31 (×2): 81 mg via ORAL
  Filled 2017-05-30 (×2): qty 1

## 2017-05-30 MED ORDER — FOLIC ACID 1 MG PO TABS
1.0000 mg | ORAL_TABLET | Freq: Every day | ORAL | Status: DC
  Administered 2017-05-30 – 2017-05-31 (×2): 1 mg via ORAL
  Filled 2017-05-30 (×2): qty 1

## 2017-05-30 MED ORDER — NICOTINE 21 MG/24HR TD PT24
21.0000 mg | MEDICATED_PATCH | Freq: Every day | TRANSDERMAL | Status: DC
  Administered 2017-05-30 – 2017-05-31 (×2): 21 mg via TRANSDERMAL
  Filled 2017-05-30 (×2): qty 1

## 2017-05-30 MED ORDER — ACETAMINOPHEN 325 MG PO TABS
650.0000 mg | ORAL_TABLET | Freq: Four times a day (QID) | ORAL | Status: DC | PRN
  Administered 2017-05-30: 650 mg via ORAL
  Filled 2017-05-30: qty 2

## 2017-05-30 MED ORDER — LORAZEPAM 2 MG/ML IJ SOLN: 1.0000 mg | Freq: Four times a day (QID) | INTRAMUSCULAR | Status: DC | PRN

## 2017-05-30 MED ORDER — LORAZEPAM 1 MG PO TABS
1.0000 mg | ORAL_TABLET | Freq: Four times a day (QID) | ORAL | Status: DC | PRN
  Administered 2017-05-30: 1 mg via ORAL
  Filled 2017-05-30: qty 1

## 2017-05-30 NOTE — Progress Notes (Addendum)
[facility] PROGRESS NOTE  Caleb Grant ONG:[id number] DOB: [birth year] DOA: 05/29/2017   PCP: Marva Panda, NP     Assessment/Plan: Principal Problem:   Intentional opiate overdose (HCC) Active Problems:   Tobacco abuse   NSVT (nonsustained ventricular tachycardia) (HCC)   CAD (coronary artery disease), native coronary artery- s/p PCI + DES to 2nd Mrg 04/2016, nl LVEF 50-55%   HLD (hyperlipidemia)   HTN (hypertension)   Narcotic overdose   Hypotension   Caleb Grant is an 46 y.o. male with hx of HTN, CAD, hx of PSVT, depression and chronic back pain on narcotics, was arguing with his fiance, told her he made an suicidal attempt by taking a whole bottle of Percocets ( ? 40 tablets), and became lethargic and unresponsive.  EMS was summoned, found him in respiratory failure, responded to Narcan.  Further evaluation included normal LFTs, and Cr of 1.5. He also has alcohol level of 160, and negative tylenol and ASA level.  Initially placed on Narcan drip. Patient maintained airway throughout.  Hospitalist was asked to admit him for intentional narcotic overdose (suicidal attempt)  Assessment and plan  Suicidal attempt by intentional overdose of Narcotics: Transfer  from stepdown to telemetry.   off   IV Narcan drip. Awake and alert.   denies using excessive alcohol.   continues to endorse suicidal ideation. Continue Recruitment consultant. Psychiatrically consult requested for clearance. Most likely will need inpatient psychiatric admission   He is    CAD:  Stable. Resume metoprolol and Lipitor  Alcohol abuse:   start CIWA protocol  DVT prophylaxsis  Lovenox   Code Status:   full code    Family Communication: Discussed in detail with the patient, all imaging results, lab results explained to the patient   Disposition Plan:  Transfer to telemetry      Consult psychiatry   procedures none  Antibiotics: Anti-infectives    None          HPI/Subjective: Awake and alert, anxious ,has a HA   Objective: Vitals:   05/29/17 1700 05/29/17 1800 05/30/17 0400 05/30/17 0500  BP: 117/80 (!) 119/93    Pulse: 75 86    Resp: 16 11    Temp:   100 F (37.8 C)   TempSrc:   Oral   SpO2: 96% 95%    Weight:    79 kg (174 lb 2.6 oz)  Height:        Intake/Output Summary (Last 24 hours) at 05/30/17 0844 Last data filed at 05/29/17 2300  Gross per 24 hour  Intake          1761.34 ml  Output                0 ml  Net          1761.34 ml    Exam:  Examination:  General exam: Appears calm and comfortable  Respiratory system: Clear to auscultation. Respiratory effort normal. Cardiovascular system: S1 & S2 heard, RRR. No JVD, murmurs, rubs, gallops or clicks. No pedal edema. Gastrointestinal system: Abdomen is nondistended, soft and nontender. No organomegaly or masses felt. Normal bowel sounds heard. Central nervous system: Alert and oriented. No focal neurological deficits. Extremities: Symmetric 5 x 5 power. Skin: No rashes, lesions or ulcers Psychiatry: Judgement and insight appear normal. Mood & affect appropriate.     Data Reviewed: I have personally reviewed following labs and imaging studies  Micro Results Recent Results (from the past  240 hour(s))  MRSA PCR Screening     Status: None   Collection Time: 05/29/17  3:52 PM  Result Value Ref Range Status   MRSA by PCR NEGATIVE NEGATIVE Final    Comment:        The GeneXpert MRSA Assay (FDA approved for NASAL specimens only), is one component of a comprehensive MRSA colonization surveillance program. It is not intended to diagnose MRSA infection nor to guide or monitor treatment for MRSA infections.     Radiology Reports No results found.   CBC  Recent Labs Lab 05/29/17 0335  WBC 13.2*  HGB 15.9  HCT 45.2  PLT 158  MCV 99.1  MCH 34.9*  MCHC 35.2  RDW 13.3    Chemistries   Recent Labs Lab 05/29/17 0335  NA 142  K 3.5  CL 107   CO2 27  GLUCOSE 140*  BUN 16  CREATININE 1.54*  CALCIUM 8.7*  AST 35  ALT 66*  ALKPHOS 66  BILITOT 0.6   ------------------------------------------------------------------------------------------------------------------ estimated creatinine clearance is 54.8 mL/min (A) (by C-G formula based on SCr of 1.54 mg/dL (H)). ------------------------------------------------------------------------------------------------------------------ No results for input(s): HGBA1C in the last 72 hours. ------------------------------------------------------------------------------------------------------------------ No results for input(s): CHOL, HDL, LDLCALC, TRIG, CHOLHDL, LDLDIRECT in the last 72 hours. ------------------------------------------------------------------------------------------------------------------  Recent Labs  05/29/17 0336  TSH 2.120   ------------------------------------------------------------------------------------------------------------------ No results for input(s): VITAMINB12, FOLATE, FERRITIN, TIBC, IRON, RETICCTPCT in the last 72 hours.  Coagulation profile No results for input(s): INR, PROTIME in the last 168 hours.  No results for input(s): DDIMER in the last 72 hours.  Cardiac Enzymes  Recent Labs Lab 05/29/17 0336  TROPONINI <0.03   ------------------------------------------------------------------------------------------------------------------ Invalid input(s): POCBNP   CBG: No results for input(s): GLUCAP in the last 168 hours.     Studies: No results found.    Lab Results  Component Value Date   HGBA1C 5.1 04/28/2016   Lab Results  Component Value Date   LDLCALC 32 03/04/2017   CREATININE 1.54 (H) 05/29/2017       Scheduled Meds: . aspirin EC  81 mg Oral Daily  . atorvastatin  40 mg Oral q1800  . enoxaparin (LOVENOX) injection  40 mg Subcutaneous Q24H  . metoprolol tartrate  12.5 mg Oral BID  . predniSONE  30 mg Oral Q  breakfast  . sodium chloride flush  3 mL Intravenous Q12H   Continuous Infusions: . dextrose 5 % and 0.9% NaCl 125 mL/hr at 05/30/17 0631  . [facility](NARCAN) adult infusion for OVERDOSE Stopped (05/29/17 1600)     LOS: 0 days    Time spent: >30 MINS    Richarda OverlieNayana Siah Steely  [facility] Pager [phone]. If 7PM-7AM, please contact night-coverage at [email or web address], password [email or web address] 05/30/2017, 8:44 AM  LOS: 0 days

## 2017-05-30 NOTE — BH Assessment (Addendum)
Tele Assessment Note  Pt presents voluntarily to [facility] BIB [facility]. Pt is cooperative and oriented x 4. He reports that he intentionally ingested approx 40 percocet during an argument with his fiancee. Pt says he then walked down the road and his children picked him up. Apparently his kids called EMS. Pt denies hx of suicide attempts. He denies hx of outpatient or inpatient MH treatment. Pt denies any depressive symptoms. He says that last week was very stressful for him. Pt reports "everything" was stressful. Pt doesn't elaborate. He reports he has a new orthopedic MD who prescribes his percocet but doesn't know MD's name. Pt denies homicidal thoughts or physical aggression. Pt denies having access to firearms. Pt denies having any legal problems at this time. Pt denies hallucinations. Pt does not appear to be responding to internal stimuli and exhibits no delusional thought. Pt's reality testing appears to be intact.Pt denies any current or past substance abuse problems. Pt does not appear to be intoxicated or in withdrawal at this time. He reports he drinks etoh once a week. 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   Caleb Grant is an 46 y.o. male.   Diagnosis: Major Depressive Disorder, Single Episode, Severe without Psychotic Features  Past Medical History:  Past Medical History:  Diagnosis Date  . Back pain   . CAD (coronary artery disease)    a. 04/2016: NSTEMI and NSVT: Cath showing 100% stenosis 2nd Mrg (PCI w/ DES), 20% stenosis mid-LAD, 40% distal LAD, and 20% proxRCA   . Tobacco abuse   . Ventricular bigeminy    a. noted during 04/2016 admission. Started on BB.    Past Surgical History:  Procedure Laterality Date  . CARDIAC CATHETERIZATION N/A 04/28/2016   Procedure: Left Heart Cath and Coronary Angiography;  Surgeon: Kathleene Hazel, MD;  [facility]: [facility] INVASIVE CV LAB;  Service: Cardiovascular;  Laterality: N/A;  . CARDIAC CATHETERIZATION N/A 04/28/2016    Procedure: Coronary Stent Intervention;  Surgeon: Kathleene Hazel, MD;  Location: [facility] INVASIVE CV LAB;  Service: Cardiovascular;  Laterality: N/A;    Family History:  Family History  Problem Relation Age of Onset  . Coronary artery disease Father   . Coronary artery disease Paternal Grandfather   . Heart disease Sister   . Heart failure Sister     Social History:  reports that he has been smoking Cigarettes.  He has been smoking about 2.00 packs per day. He has never used smokeless tobacco. He reports that he does not drink alcohol or use drugs.  Additional Social History:  Alcohol / Drug Use Pain Medications: pt denies abuse - see pta meds list Prescriptions: pt denies abuse - see pta meds list Over the Counter: pt denies abuse - see pta meds list History of alcohol / drug use?: Yes Substance #1 Name of Substance 1: etoh 1 - Age of First Use: 46 yo 1 - Amount (size/oz): varies 1 - Frequency: once a week 1 - Last Use / Amount: 05/29/17 3 or 4 shots   CIWA: CIWA-Ar BP: 122/81 Pulse Rate: 79 Nausea and Vomiting: no nausea and no vomiting Tactile Disturbances: none Tremor: no tremor Auditory Disturbances: not present Paroxysmal Sweats: no sweat visible Visual Disturbances: not present Anxiety: no anxiety, at ease Headache, Fullness in Head: none present Agitation: somewhat more than normal activity Orientation and Clouding of Sensorium: oriented and can do serial additions CIWA-Ar Total: 1 COWS:    PATIENT STRENGTHS: (choose at least two) Average  or above average intelligence Capable of independent living Communication skills Supportive family/friends Work skills  Allergies: No Known Allergies  Home Medications:  Medications Prior to Admission  Medication Sig Dispense Refill  . aspirin EC 81 MG EC tablet Take 1 tablet (81 mg total) by mouth daily.    Marland Kitchen atorvastatin (LIPITOR) 40 MG tablet TAKE 1 TABLET BY MOUTH EVERY DAY 90 tablet 3  . metoprolol tartrate  (LOPRESSOR) 25 MG tablet Take 0.5 tablets (12.5 mg total) by mouth 2 (two) times daily. 180 tablet 3  . nitroGLYCERIN (NITROSTAT) 0.4 MG SL tablet Place 1 tablet (0.4 mg total) under the tongue every 5 (five) minutes x 3 doses as needed for chest pain. 25 tablet 6  . sildenafil (VIAGRA) 50 MG tablet Take 1 tablet (50 mg total) by mouth daily as needed for erectile dysfunction. 15 tablet 6    OB/GYN Status:  No LMP for male patient.  General Assessment Data Location of Assessment: [facility] ED TTS Assessment: In system Is this a Tele or Face-to-Face Assessment?: Tele Assessment Is this an Initial Assessment or a Re-assessment for this encounter?: Initial Assessment Marital status: Long term relationship Juanell Fairly name: none Is patient pregnant?: No Pregnancy Status: No Living Arrangements: Spouse/significant other, Non-relatives/Friends (fiancee & fiancee's 76 yo son) Can pt return to current living arrangement?: Yes Admission Status: Voluntary Is patient capable of signing voluntary admission?: Yes Referral Source: Self/Family/Friend Insurance type: blue cross     Crisis Care Plan Living Arrangements: Spouse/significant other, Non-relatives/Friends (fiancee & fiancee's 71 yo son) Name of Psychiatrist: none Name of Therapist: none  Education Status Is patient currently in school?: No Highest grade of school patient has completed: 9  Risk to self with the past 6 months Suicidal Ideation: No Has patient been a risk to self within the past 6 months prior to admission? : Yes Suicidal Intent: No Has patient had any suicidal intent within the past 6 months prior to admission? : Yes Is patient at risk for suicide?: Yes Suicidal Plan?: No Has patient had any suicidal plan within the past 6 months prior to admission? : Yes Access to Means: Yes Specify Access to Suicidal Means: access to meds What has been your use of drugs/alcohol within the last 12 months?: none Previous Attempts/Gestures:  No How many times?: 0 Other Self Harm Risks: none Intentional Self Injurious Behavior: None Family Suicide History: No Recent stressful life event(s): Financial Problems ("everything") Persecutory voices/beliefs?: No Depression: No Depression Symptoms:  (pt denies) Substance abuse history and/or treatment for substance abuse?: No Suicide prevention information given to non-admitted patients: Not applicable  Risk to Others within the past 6 months Homicidal Ideation: No Does patient have any lifetime risk of violence toward others beyond the six months prior to admission? : No Thoughts of Harm to Others: No Current Homicidal Intent: No Current Homicidal Plan: No Access to Homicidal Means: No Identified Victim: none History of harm to others?: No Assessment of Violence: None Noted Violent Behavior Description: pt denies hx violence Does patient have access to weapons?: No Criminal Charges Pending?: No Does patient have a court date: No Is patient on probation?: No  Psychosis Hallucinations: None noted Delusions: None noted  Mental Status Report Appearance/Hygiene: Unremarkable, In hospital gown Eye Contact: Good Motor Activity: Freedom of movement Speech: Logical/coherent Level of Consciousness: Sleeping, Quiet/awake Mood: Anxious ("i don't know") Affect: Appropriate to circumstance Anxiety Level: Moderate Thought Processes: Relevant, Coherent Judgement: Unimpaired Orientation: Person, Situation, Time, Place Obsessive Compulsive Thoughts/Behaviors: None  Cognitive  Functioning Concentration: Normal Memory: Recent Intact, Remote Intact IQ: Average Insight: Poor Impulse Control: Poor Appetite: Fair Sleep: No Change Vegetative Symptoms: None  ADLScreening [facility] Assessment Services) Patient's cognitive ability adequate to safely complete daily activities?: Yes Patient able to express need for assistance with ADLs?: Yes Independently performs ADLs?: Yes (appropriate  for developmental age)  Prior Inpatient Therapy Prior Inpatient Therapy: No  Prior Outpatient Therapy Prior Outpatient Therapy: No Does patient have an ACCT team?: No Does patient have Intensive In-House Services?  : No Does patient have [facility] services? : No Does patient have P4CC services?: No  ADL Screening (condition at time of admission) Patient's cognitive ability adequate to safely complete daily activities?: Yes Is the patient deaf or have difficulty hearing?: No Does the patient have difficulty seeing, even when wearing glasses/contacts?: No Does the patient have difficulty concentrating, remembering, or making decisions?: No Patient able to express need for assistance with ADLs?: Yes Does the patient have difficulty dressing or bathing?: No Independently performs ADLs?: Yes (appropriate for developmental age) Does the patient have difficulty walking or climbing stairs?: No Weakness of Legs: None Weakness of Arms/Hands: None  Home Assistive Devices/Equipment Home Assistive Devices/Equipment: Eyeglasses  Therapy Consults (therapy consults require a physician order) PT Evaluation Needed: No OT Evalulation Needed: No SLP Evaluation Needed: No Abuse/Neglect Assessment (Assessment to be complete while patient is alone) Physical Abuse: Denies Verbal Abuse: Denies Sexual Abuse: Denies Exploitation of patient/patient's resources: Denies Self-Neglect: Denies Values / Beliefs Cultural Requests During Hospitalization: None Spiritual Requests During Hospitalization: None Consults Spiritual Care Consult Needed: No Social Work Consult Needed: No Merchant navy officerAdvance Directives (For Healthcare) Does Patient Have a Medical Advance Directive?: No Would patient like information on creating a medical advance directive?: No - Patient declined Nutrition Screen- [facility] Patient's home diet: Regular Has the patient recently lost weight without trying?: No Has the patient been eating  poorly because of a decreased appetite?: No Malnutrition Screening Tool Score: 0  Additional Information 1:1 In Past 12 Months?: No CIRT Risk: No Elopement Risk: No Does patient have medical clearance?: Yes     Disposition:  Disposition Initial Assessment Completed for this Encounter: Yes Disposition of Patient: Inpatient treatment program Type of inpatient treatment program: Adult (tina okonkwo np recommends inpatient )     Sadrac Zeoli P 05/30/2017 3:18 PM

## 2017-05-30 NOTE — Progress Notes (Signed)
Patient was sleeping. Will follow up on 05/31/17.

## 2017-05-30 NOTE — Progress Notes (Signed)
[facility]  Disposition CSW will follow.  Timmothy EulerJean T. Kaylyn LimSutter, MSW, LCSWA Disposition Clinical Social Work [phone] (cell) [phone] (office)

## 2017-05-31 ENCOUNTER — Encounter (HOSPITAL_COMMUNITY): Payer: Self-pay | Admitting: *Deleted

## 2017-05-31 ENCOUNTER — Inpatient Hospital Stay (HOSPITAL_COMMUNITY): Payer: BLUE CROSS/BLUE SHIELD

## 2017-05-31 ENCOUNTER — Inpatient Hospital Stay (HOSPITAL_COMMUNITY)
Admission: AD | Admit: 2017-05-31 | Discharge: 2017-06-03 | DRG: 885 | Disposition: A | Discharge status: Home / Self Care | Payer: BLUE CROSS/BLUE SHIELD | Source: Intra-hospital | Attending: Psychiatry | Admitting: Psychiatry

## 2017-05-31 DIAGNOSIS — Z955 Presence of coronary angioplasty implant and graft: Secondary | ICD-10-CM | POA: Diagnosis not present

## 2017-05-31 DIAGNOSIS — R51 Headache: Secondary | ICD-10-CM | POA: Diagnosis present

## 2017-05-31 DIAGNOSIS — F329 Major depressive disorder, single episode, unspecified: Secondary | ICD-10-CM | POA: Diagnosis not present

## 2017-05-31 DIAGNOSIS — D72829 Elevated white blood cell count, unspecified: Secondary | ICD-10-CM | POA: Diagnosis present

## 2017-05-31 DIAGNOSIS — I2510 Atherosclerotic heart disease of native coronary artery without angina pectoris: Secondary | ICD-10-CM | POA: Diagnosis present

## 2017-05-31 DIAGNOSIS — I252 Old myocardial infarction: Secondary | ICD-10-CM | POA: Diagnosis not present

## 2017-05-31 DIAGNOSIS — Z9861 Coronary angioplasty status: Secondary | ICD-10-CM | POA: Diagnosis not present

## 2017-05-31 DIAGNOSIS — J441 Chronic obstructive pulmonary disease with (acute) exacerbation: Secondary | ICD-10-CM | POA: Diagnosis present

## 2017-05-31 DIAGNOSIS — Z8249 Family history of ischemic heart disease and other diseases of the circulatory system: Secondary | ICD-10-CM | POA: Diagnosis not present

## 2017-05-31 DIAGNOSIS — F10920 Alcohol use, unspecified with intoxication, uncomplicated: Secondary | ICD-10-CM | POA: Diagnosis not present

## 2017-05-31 DIAGNOSIS — F10120 Alcohol abuse with intoxication, uncomplicated: Secondary | ICD-10-CM | POA: Diagnosis present

## 2017-05-31 DIAGNOSIS — I10 Essential (primary) hypertension: Secondary | ICD-10-CM | POA: Diagnosis present

## 2017-05-31 DIAGNOSIS — Z79899 Other long term (current) drug therapy: Secondary | ICD-10-CM

## 2017-05-31 DIAGNOSIS — E785 Hyperlipidemia, unspecified: Secondary | ICD-10-CM | POA: Diagnosis present

## 2017-05-31 DIAGNOSIS — T40602A Poisoning by unspecified narcotics, intentional self-harm, initial encounter: Secondary | ICD-10-CM | POA: Diagnosis present

## 2017-05-31 DIAGNOSIS — Z7952 Long term (current) use of systemic steroids: Secondary | ICD-10-CM | POA: Diagnosis not present

## 2017-05-31 DIAGNOSIS — T402X2A Poisoning by other opioids, intentional self-harm, initial encounter: Secondary | ICD-10-CM | POA: Diagnosis present

## 2017-05-31 DIAGNOSIS — F32A Depression, unspecified: Secondary | ICD-10-CM | POA: Diagnosis present

## 2017-05-31 DIAGNOSIS — G8929 Other chronic pain: Secondary | ICD-10-CM | POA: Diagnosis present

## 2017-05-31 DIAGNOSIS — T1491XA Suicide attempt, initial encounter: Secondary | ICD-10-CM | POA: Diagnosis not present

## 2017-05-31 DIAGNOSIS — Z7982 Long term (current) use of aspirin: Secondary | ICD-10-CM | POA: Diagnosis not present

## 2017-05-31 DIAGNOSIS — I959 Hypotension, unspecified: Secondary | ICD-10-CM | POA: Diagnosis not present

## 2017-05-31 DIAGNOSIS — F322 Major depressive disorder, single episode, severe without psychotic features: Secondary | ICD-10-CM | POA: Diagnosis present

## 2017-05-31 DIAGNOSIS — Z720 Tobacco use: Secondary | ICD-10-CM | POA: Diagnosis present

## 2017-05-31 DIAGNOSIS — F17210 Nicotine dependence, cigarettes, uncomplicated: Secondary | ICD-10-CM | POA: Diagnosis not present

## 2017-05-31 LAB — CBC
HCT: 42.1 % (ref 39.0–52.0)
Hemoglobin: 14.7 g/dL (ref 13.0–17.0)
MCH: 34.2 pg — ABNORMAL HIGH (ref 26.0–34.0)
MCHC: 34.9 g/dL (ref 30.0–36.0)
MCV: 97.9 fL (ref 78.0–100.0)
PLATELETS: 140 10*3/uL — AB (ref 150–400)
RBC: 4.3 MIL/uL (ref 4.22–5.81)
RDW: 12.7 % (ref 11.5–15.5)
WBC: 8.4 10*3/uL (ref 4.0–10.5)

## 2017-05-31 MED ORDER — NICOTINE 21 MG/24HR TD PT24: 21.0000 mg | MEDICATED_PATCH | Freq: Every day | TRANSDERMAL | 0 refills | Status: DC

## 2017-05-31 MED ORDER — PREDNISONE 50 MG PO TABS
50.0000 mg | ORAL_TABLET | Freq: Every day | ORAL | 0 refills | Status: DC
Start: 2017-06-01 — End: 2017-06-04

## 2017-05-31 MED ORDER — ACETAMINOPHEN 325 MG PO TABS: 650.0000 mg | ORAL_TABLET | Freq: Four times a day (QID) | ORAL | Status: DC | PRN

## 2017-05-31 MED ORDER — METOPROLOL TARTRATE 25 MG PO TABS
ORAL_TABLET | ORAL | Status: AC
  Administered 2017-05-31: 25 mg via ORAL
  Filled 2017-05-31: qty 1

## 2017-05-31 MED ORDER — THIAMINE HCL 100 MG PO TABS: 100.0000 mg | ORAL_TABLET | Freq: Every day | ORAL | 1 refills | Status: DC

## 2017-05-31 MED ORDER — NICOTINE 21 MG/24HR TD PT24
21.0000 mg | MEDICATED_PATCH | Freq: Every day | TRANSDERMAL | Status: DC
  Administered 2017-06-01 – 2017-06-03 (×3): 21 mg via TRANSDERMAL
  Filled 2017-05-31 (×6): qty 1

## 2017-05-31 MED ORDER — ASPIRIN EC 81 MG PO TBEC
81.0000 mg | DELAYED_RELEASE_TABLET | Freq: Every day | ORAL | Status: DC
  Administered 2017-06-01 – 2017-06-03 (×3): 81 mg via ORAL
  Filled 2017-05-31 (×5): qty 1

## 2017-05-31 MED ORDER — NICOTINE 21 MG/24HR TD PT24
21.0000 mg | MEDICATED_PATCH | Freq: Every day | TRANSDERMAL | Status: DC
  Filled 2017-05-31: qty 1

## 2017-05-31 MED ORDER — PREDNISONE 10 MG PO TABS
30.0000 mg | ORAL_TABLET | Freq: Every day | ORAL | Status: DC
  Administered 2017-06-01: 09:00:00 30 mg via ORAL
  Filled 2017-05-31 (×3): qty 1

## 2017-05-31 MED ORDER — ATORVASTATIN CALCIUM 40 MG PO TABS
40.0000 mg | ORAL_TABLET | Freq: Every day | ORAL | Status: DC
  Administered 2017-06-01 – 2017-06-02 (×2): 40 mg via ORAL
  Filled 2017-05-31 (×4): qty 1

## 2017-05-31 MED ORDER — AMOXICILLIN-POT CLAVULANATE 875-125 MG PO TABS: 1.0000 | ORAL_TABLET | Freq: Two times a day (BID) | ORAL | 0 refills | Status: DC

## 2017-05-31 MED ORDER — FOLIC ACID 1 MG PO TABS: 1.0000 mg | ORAL_TABLET | Freq: Every day | ORAL | 1 refills | Status: DC

## 2017-05-31 MED ORDER — METOPROLOL TARTRATE 12.5 MG HALF TABLET
12.5000 mg | ORAL_TABLET | Freq: Two times a day (BID) | ORAL | Status: DC
  Administered 2017-05-31: 25 mg via ORAL
  Administered 2017-05-31 – 2017-06-03 (×6): 12.5 mg via ORAL
  Filled 2017-05-31 (×10): qty 1

## 2017-05-31 NOTE — Progress Notes (Signed)
Admission Note:  46 year old male who presents, in no acute distress, for the treatment of SI following an intentional overdose of "a handful of oxycodone 10's".  Patient verbalizes remorse and states "I made a stupid mistake".  Patient presents pleasant and anxious. Patient was cooperative with admission process.  Patient currently denies SI and contracts for safety upon admission. Patient denies AVH. Patient identifies main stressor as "My bank account was short due to my youngin' overdrafting my account".  Patient reports a strong support system with fiance, kids, and grandkids. Patient states "I have a job and everything.  I'm not depressed. It was just a mistake".  Patient reports hx of bladder cancer twice and a MI last year. [facility] patient would like to "learn how to cope with daily stress" and "communicate with others better". Skin was assessed and found to be clear of any abnormal marks apart from a bruise to left upper arm.  Patient searched and no contraband found, POC and unit policies explained and understanding verbalized. Consents obtained.  [facility].  Report given to accepting RN.  Patient placed on q 15 minute safety checks.  Patient had no additional questions or concerns.  Safety maintained on the unit.

## 2017-05-31 NOTE — Progress Notes (Addendum)
[facility]  Ferne ReusJustina Okonkwo, NP is the accepting provider, Dr. Nehemiah MassedFernando Cobos, is the attending provider.  Call report to [phone].  Patient may transfer at any time [facility]  CSW notified [facility] ICU ED Nurse, Brayton CavesJessie.  Timmothy EulerJean T. Kaylyn LimSutter, MSW, LCSWA Disposition Clinical Social Work [phone] (cell) [phone] (office)

## 2017-05-31 NOTE — Tx Team (Signed)
Initial Treatment Plan 05/31/2017 8:29 PM Caleb FeilJames W Castell QIO:[id number]    PATIENT STRESSORS: Financial difficulties Marital or family conflict   PATIENT STRENGTHS: Ability for insight Capable of independent living Communication skills Motivation for treatment/growth Supportive family/friends   PATIENT IDENTIFIED PROBLEMS: At risk for suicide  "Learn how to cope with daily stress"  "Communicate with others better"                 DISCHARGE CRITERIA:  Ability to meet basic life and health needs Improved stabilization in mood, thinking, and/or behavior Motivation to continue treatment in a less acute level of care Need for constant or close observation no longer present Verbal commitment to aftercare and medication compliance  PRELIMINARY DISCHARGE PLAN: Outpatient therapy Return to previous living arrangement Return to previous work or school arrangements  PATIENT/FAMILY INVOLVEMENT: This treatment plan has been presented to and reviewed with the patient, Caleb Grant.  The patient and family have been given the opportunity to ask questions and make suggestions.  Carleene OverlieMiddleton, Undrea Shipes P, RN 05/31/2017, 8:29 PM

## 2017-05-31 NOTE — Progress Notes (Addendum)
[facility] PROGRESS NOTE  Caleb Grant:[id number] DOB: 02-Jun-1971 DOA: 05/29/2017   PCP: Marva Panda, NP     Assessment/Plan: Principal Problem:   Intentional opiate overdose (HCC) Active Problems:   Tobacco abuse   NSVT (nonsustained ventricular tachycardia) (HCC)   CAD (coronary artery disease), native coronary artery- s/p PCI + DES to 2nd Mrg 04/2016, nl LVEF 50-55%   HLD (hyperlipidemia)   HTN (hypertension)   Narcotic overdose   Hypotension   Alcoholic intoxication without complication (HCC)   Caleb Grant is an 46 y.o. male with hx of HTN, CAD, hx of PSVT, depression and chronic back pain on narcotics, was arguing with his fiance, told her he made an suicidal attempt by taking a whole bottle of Percocets ( ? 40 tablets), and became lethargic and unresponsive.  EMS was summoned, found him in respiratory failure, responded to Narcan.  Further evaluation included normal LFTs, and Cr of 1.5. He also has alcohol level of 160, and negative tylenol and ASA level.  Initially placed on Narcan drip. Patient maintained airway throughout.  Hospitalist was asked to admit him for intentional narcotic overdose (suicidal attempt). Now awaiting behavioral health  Assessment and plan  Suicidal attempt by intentional overdose of Narcotics: Stable from a respiratory standpoint.   off   IV Narcan drip. Awake and alert.   denies using excessive alcohol.   continues to endorse suicidal ideation. Continue Recruitment consultant. Psychiatrically consult requested for clearance. Most likely will need inpatient psychiatric admission   He is  currently awaiting bed  CAD:  Stable. Continue metoprolol and Lipitor  Alcohol abuse:   continue CIWA protocol  DVT prophylaxsis  Lovenox   Code Status:   full code    Family Communication: Discussed in detail with the patient, all imaging results, lab results explained to the patient   Disposition Plan:  Will transfer to MedSurg, awaiting  placement and psychiatric consult      Consult psychiatry   procedures none  Antibiotics: Anti-infectives    None         HPI/Subjective: Headache is improved since yesterday, patient denies any chest pain shortness of breath, continues to be suicidal  Objective: Vitals:   05/30/17 2300 05/31/17 0000 05/31/17 0212 05/31/17 0800  BP: 123/84 123/78    Pulse: 83 82    Resp: 10 13    Temp:   98.8 F (37.1 C) 98.2 F (36.8 C)  TempSrc:   Oral   SpO2: 92% 91%    Weight:      Height:        Intake/Output Summary (Last 24 hours) at 05/31/17 0906 Last data filed at 05/31/17 0848  Gross per 24 hour  Intake             3605 ml  Output             1500 ml  Net             2105 ml    Exam:  Examination:  General exam: Appears calm and comfortable  Respiratory system: Clear to auscultation. Respiratory effort normal. Cardiovascular system: S1 & S2 heard, RRR. No JVD, murmurs, rubs, gallops or clicks. No pedal edema. Gastrointestinal system: Abdomen is nondistended, soft and nontender. No organomegaly or masses felt. Normal bowel sounds heard. Central nervous system: Alert and oriented. No focal neurological deficits. Extremities: Symmetric 5 x 5 power. Skin: No rashes, lesions or ulcers Psychiatry: Judgement and insight appear normal.  Mood & affect appropriate.     Data Reviewed: I have personally reviewed following labs and imaging studies  Micro Results Recent Results (from the past 240 hour(s))  MRSA PCR Screening     Status: None   Collection Time: 05/29/17  3:52 PM  Result Value Ref Range Status   MRSA by PCR NEGATIVE NEGATIVE Final    Comment:        The GeneXpert MRSA Assay (FDA approved for NASAL specimens only), is one component of a comprehensive MRSA colonization surveillance program. It is not intended to diagnose MRSA infection nor to guide or monitor treatment for MRSA infections.     Radiology Reports No results found.    CBC  Recent Labs Lab 05/29/17 0335  WBC 13.2*  HGB 15.9  HCT 45.2  PLT 158  MCV 99.1  MCH 34.9*  MCHC 35.2  RDW 13.3    Chemistries   Recent Labs Lab 05/29/17 0335 05/30/17 1228  NA 142 138  K 3.5 4.0  CL 107 107  CO2 27 26  GLUCOSE 140* 144*  BUN 16 9  CREATININE 1.54* 1.18  CALCIUM 8.7* 7.9*  AST 35 25  ALT 66* 40  ALKPHOS 66 65  BILITOT 0.6 0.9   ------------------------------------------------------------------------------------------------------------------ estimated creatinine clearance is 71.6 mL/min (by C-G formula based on SCr of 1.18 mg/dL). ------------------------------------------------------------------------------------------------------------------ No results for input(s): HGBA1C in the last 72 hours. ------------------------------------------------------------------------------------------------------------------ No results for input(s): CHOL, HDL, LDLCALC, TRIG, CHOLHDL, LDLDIRECT in the last 72 hours. ------------------------------------------------------------------------------------------------------------------  Recent Labs  05/29/17 0336  TSH 2.120   ------------------------------------------------------------------------------------------------------------------ No results for input(s): VITAMINB12, FOLATE, FERRITIN, TIBC, IRON, RETICCTPCT in the last 72 hours.  Coagulation profile No results for input(s): INR, PROTIME in the last 168 hours.  No results for input(s): DDIMER in the last 72 hours.  Cardiac Enzymes  Recent Labs Lab 05/29/17 0336  TROPONINI <0.03   ------------------------------------------------------------------------------------------------------------------ Invalid input(s): POCBNP   CBG: No results for input(s): GLUCAP in the last 168 hours.     Studies: No results found.    Lab Results  Component Value Date   HGBA1C 5.1 04/28/2016   Lab Results  Component Value Date   LDLCALC 32 03/04/2017    CREATININE 1.18 05/30/2017       Scheduled Meds: . aspirin EC  81 mg Oral Daily  . atorvastatin  40 mg Oral q1800  . enoxaparin (LOVENOX) injection  40 mg Subcutaneous Q24H  . folic acid  1 mg Oral Daily  . metoprolol tartrate  12.5 mg Oral BID  . multivitamin with minerals  1 tablet Oral Daily  . nicotine  21 mg Transdermal Daily  . predniSONE  30 mg Oral Q breakfast  . sodium chloride flush  3 mL Intravenous Q12H  . thiamine  100 mg Oral Daily   Or  . thiamine  100 mg Intravenous Daily   Continuous Infusions: . dextrose 5 % and 0.9% NaCl 125 mL/hr at 05/30/17 2235  . naLOXone [facility](NARCAN) adult infusion for OVERDOSE Stopped (05/29/17 1600)     LOS: 0 days    Time spent: >30 MINS    Richarda OverlieNayana Dwayna Kentner  [facility] Pager [phone]. If 7PM-7AM, please contact night-coverage at [email or web address], password [email or web address] 05/31/2017, 9:06 AM  LOS: 0 days

## 2017-05-31 NOTE — Progress Notes (Signed)
Present with Caleb Grant for support. He communicated at first around issues of why he had taken so much medication and shared he wasn't sure why he did this. I shared that the why question wasn't the reason for the visit but stated it was for support around moving forward after this incident. This led to a discussion around his loss of his mother, father and most recently a sister (around 3 years ago) to cancer. He shared how he realized he "became shut-off" to others and especially his family. He shared he made the decision to be more of himself again. He didn't share what led him to take the extra medication but said this felt like a wake up call for him to be able to be there for his family, especially his 3 children and 5 grandchildren. We talked further about putting the pieces back together in light of his grief and loss issues.

## 2017-05-31 NOTE — Discharge Summary (Addendum)
Physician Discharge Summary  Caleb Grant MRN: [id number] DOB/AGE: 1971/02/26 46 y.o.  PCP: Everardo Beals, NP   Admit date: 05/29/2017 Discharge date: 05/31/2017  Discharge Diagnoses:    Principal Problem:   Intentional opiate overdose [facility]) Active Problems:   Tobacco abuse   NSVT (nonsustained ventricular tachycardia) (HCC)   CAD (coronary artery disease), native coronary artery- s/p PCI + DES to 2nd Mrg 04/2016, nl LVEF 50-55%   HLD (hyperlipidemia)   HTN (hypertension)   Narcotic overdose   Hypotension   Alcoholic intoxication without complication ([place])    Follow-up recommendations Follow-up with PCP in 3-5 days , including all  additional recommended appointments as below Follow-up CBC, CMP in 3-5 days Patient is being discharged to behavioral health for suicidal ideation     Current Discharge Medication List    START taking these medications   Details  amoxicillin-clavulanate (AUGMENTIN) 875-125 MG tablet Take 1 tablet by mouth 2 (two) times daily. Qty: 28 tablet, Refills: 0    folic acid (FOLVITE) 1 MG tablet Take 1 tablet (1 mg total) by mouth daily. Qty: 30 tablet, Refills: 1    nicotine (NICODERM CQ - DOSED IN MG/24 HOURS) 21 mg/24hr patch Place 1 patch (21 mg total) onto the skin daily. Qty: 28 patch, Refills: 0    predniSONE (DELTASONE) 50 MG tablet Take 1 tablet (50 mg total) by mouth daily with breakfast. Qty: 5 tablet, Refills: 0    thiamine 100 MG tablet Take 1 tablet (100 mg total) by mouth daily. Qty: 30 tablet, Refills: 1      CONTINUE these medications which have NOT CHANGED   Details  aspirin EC 81 MG EC tablet Take 1 tablet (81 mg total) by mouth daily.    atorvastatin (LIPITOR) 40 MG tablet TAKE 1 TABLET BY MOUTH EVERY DAY Qty: 90 tablet, Refills: 3    metoprolol tartrate (LOPRESSOR) 25 MG tablet Take 0.5 tablets (12.5 mg total) by mouth 2 (two) times daily. Qty: 180 tablet, Refills: 3    nitroGLYCERIN (NITROSTAT) 0.4 MG SL  tablet Place 1 tablet (0.4 mg total) under the tongue every 5 (five) minutes x 3 doses as needed for chest pain. Qty: 25 tablet, Refills: 6    sildenafil (VIAGRA) 50 MG tablet Take 1 tablet (50 mg total) by mouth daily as needed for erectile dysfunction. Qty: 15 tablet, Refills: 6          Discharge Condition: Stable   Discharge Instructions Get Medicines reviewed and adjusted: Please take all your medications with you for your next visit with your Primary MD  Please request your Primary MD to go over all hospital tests and procedure/radiological results at the follow up, please ask your Primary MD to get all Hospital records sent to his/her office.  If you experience worsening of your admission symptoms, develop shortness of breath, life threatening emergency, suicidal or homicidal thoughts you must seek medical attention immediately by calling 911 or calling your MD immediately if symptoms less severe.  You must read complete instructions/literature along with all the possible adverse reactions/side effects for all the Medicines you take and that have been prescribed to you. Take any new Medicines after you have completely understood and accpet all the possible adverse reactions/side effects.   Do not drive when taking Pain medications.   Do not take more than prescribed Pain, Sleep and Anxiety Medications  Special Instructions: If you have smoked or chewed Tobacco in the last 2 yrs please stop smoking, stop any regular  Alcohol and or any Recreational drug use.  Wear Seat belts while driving.  Please note  You were cared for by a hospitalist during your hospital stay. Once you are discharged, your primary care physician will handle any further medical issues. Please note that NO REFILLS for any discharge medications will be authorized once you are discharged, as it is imperative that you return to your primary care physician (or establish a relationship with a primary care  physician if you do not have one) for your aftercare needs so that they can reassess your need for medications and monitor your lab values.  Discharge Instructions    Diet - low sodium heart healthy    Complete by:  As directed    Increase activity slowly    Complete by:  As directed        No Known Allergies    Disposition: Behavioral health   Consults:  Psychiatry       Filed Weights   05/29/17 1539 05/29/17 1544 05/30/17 0500  Weight: 77.6 kg (171 lb 1.2 oz) 77.6 kg (171 lb 1.2 oz) 79 kg (174 lb 2.6 oz)     Microbiology: Recent Results (from the past 240 hour(s))  MRSA PCR Screening     Status: None   Collection Time: 05/29/17  3:52 PM  Result Value Ref Range Status   MRSA by PCR NEGATIVE NEGATIVE Final    Comment:        The GeneXpert MRSA Assay (FDA approved for NASAL specimens only), is one component of a comprehensive MRSA colonization surveillance program. It is not intended to diagnose MRSA infection nor to guide or monitor treatment for MRSA infections.        Blood Culture No results found for: SDES, [place], CULT, REPTSTATUS    Labs: Results for orders placed or performed during the hospital encounter of 05/29/17 (from the past 48 hour(s))  MRSA PCR Screening     Status: None   Collection Time: 05/29/17  3:52 PM  Result Value Ref Range   MRSA by PCR NEGATIVE NEGATIVE    Comment:        The GeneXpert MRSA Assay (FDA approved for NASAL specimens only), is one component of a comprehensive MRSA colonization surveillance program. It is not intended to diagnose MRSA infection nor to guide or monitor treatment for MRSA infections.   Comprehensive metabolic panel     Status: Abnormal   Collection Time: 05/30/17 12:28 PM  Result Value Ref Range   Sodium 138 135 - 145 mmol/L   Potassium 4.0 3.5 - 5.1 mmol/L   Chloride 107 101 - 111 mmol/L   CO2 26 22 - 32 mmol/L   Glucose, Bld 144 (H) 65 - 99 mg/dL   BUN 9 6 - 20 mg/dL    Creatinine, Ser 1.18 0.61 - 1.24 mg/dL   Calcium 7.9 (L) 8.9 - 10.3 mg/dL   Total Protein 5.7 (L) 6.5 - 8.1 g/dL   Albumin 3.1 (L) 3.5 - 5.0 g/dL   AST 25 15 - 41 U/L   ALT 40 17 - 63 U/L   Alkaline Phosphatase 65 38 - 126 U/L   Total Bilirubin 0.9 0.3 - 1.2 mg/dL   GFR calc non Af Amer >60 >60 mL/min   GFR calc Af Amer >60 >60 mL/min    Comment: (NOTE) The eGFR has been calculated using the CKD EPI equation. This calculation has not been validated in all clinical situations. eGFR's persistently <60 mL/min signify possible Chronic Kidney  Disease.    Anion gap 5 5 - 15     Lipid Panel     Component Value Date/Time   CHOL 81 (L) 03/04/2017 0839   TRIG 132 03/04/2017 0839   HDL 23 (L) 03/04/2017 0839   CHOLHDL 3.5 03/04/2017 0839   CHOLHDL 5.6 04/29/2016 0232   VLDL 26 04/29/2016 0232   LDLCALC 32 03/04/2017 0839     Lab Results  Component Value Date   HGBA1C 5.1 04/28/2016     Lab Results  Component Value Date   LDLCALC 32 03/04/2017   CREATININE 1.18 05/30/2017     HPI :  Caleb Grant an 46 y.o.malewith hx of HTN, CAD, hx of PSVT, depression and chronic back pain on narcotics, was arguing with his fiance, told her he made an suicidal attempt by taking a whole bottle of Percocets ( ? 40 tablets), and became lethargic and unresponsive. EMS was summoned, found him in respiratory failure, responded to Narcan. Further evaluation included normal LFTs, and Cr of 1.5. He also has alcohol level of 160, and negative tylenol and ASA level.  Initially placed on Narcan drip. Patient maintained airway throughout. Hospitalist was asked to admit him for intentional narcotic overdose (suicidal attempt)  HOSPITAL COURSE:   Suicidal attempt by intentional overdose of Narcotics: Transfer  from stepdown to telemetry.  off   IV Narcan drip. Awake and alert.  denies using excessive alcohol.  continues to endorse suicidal ideation. Continue Air cabin crew. Psychiatrically consult  requested for clearance. TX to  inpatient psychiatry    CAD: Stable. Resume metoprolol and Lipitor  Alcohol abuse:  did not score on CIWA   Acute COPD exacerbation /Leukocytosis likely stress margination, may have  overt aspiration due to obtundation after overdose ,CXR negative , on prednisone for 5 days , augmentin for 5 days    Discharge Exam:   Blood pressure 123/78, pulse 82, temperature 98.2 F (36.8 C), resp. rate 13, height _0  (1.702 m), weight 79 kg (174 lb 2.6 oz), SpO2 91 %.  General exam: Appears calm and comfortable  Respiratory system: Clear to auscultation. Respiratory effort normal. Cardiovascular system: S1 & S2 heard, RRR. No JVD, murmurs, rubs, gallops or clicks. No pedal edema. Gastrointestinal system: Abdomen is nondistended, soft and nontender. No organomegaly or masses felt. Normal bowel sounds heard. Central nervous system: Alert and oriented. No focal neurological deficits. Extremities: Symmetric 5 x 5 power. Skin: No rashes, lesions or ulcers Psychiatry: Judgement and insight appear normal. Mood & affect appropriate    Follow-up Information    Everardo Beals, NP. Call.   Why:  hospital follow up after DC from [facility] in 3-5 days  Contact information: [facility] [place] [address] [phone]           Signed: Reyne Dumas 05/31/2017, 12:32 PM        Time spent >1 hour

## 2017-06-01 DIAGNOSIS — F17210 Nicotine dependence, cigarettes, uncomplicated: Secondary | ICD-10-CM

## 2017-06-01 DIAGNOSIS — F10920 Alcohol use, unspecified with intoxication, uncomplicated: Secondary | ICD-10-CM

## 2017-06-01 DIAGNOSIS — T1491XA Suicide attempt, initial encounter: Secondary | ICD-10-CM

## 2017-06-01 DIAGNOSIS — T402X2A Poisoning by other opioids, intentional self-harm, initial encounter: Secondary | ICD-10-CM

## 2017-06-01 DIAGNOSIS — F329 Major depressive disorder, single episode, unspecified: Secondary | ICD-10-CM

## 2017-06-01 DIAGNOSIS — I959 Hypotension, unspecified: Secondary | ICD-10-CM

## 2017-06-01 MED ORDER — PREDNISONE 10 MG PO TABS
10.0000 mg | ORAL_TABLET | Freq: Every day | ORAL | Status: AC
  Administered 2017-06-03: 10 mg via ORAL
  Filled 2017-06-01: qty 1

## 2017-06-01 MED ORDER — PREDNISONE 20 MG PO TABS
20.0000 mg | ORAL_TABLET | Freq: Every day | ORAL | Status: AC
  Administered 2017-06-02: 20 mg via ORAL
  Filled 2017-06-01: qty 1

## 2017-06-01 NOTE — Progress Notes (Signed)
Recreation Therapy Notes  Date:06/01/2017 Time:9:30am Location: 300 Hall Dayroom  Group Topic: Stress Management  Goal Area(s) Addresses:  Patient will verbalize importance of using healthy stress management.  Patient will identify positive emotions associated with healthy stress management.   Intervention: Stress Management  Activity: Calming Color Relaxation Visualization . Recreation Therapy Intern introduced the stress management technique of visualization. Recreation Therapy Intern read a script that allowed patients to mentally visualize different colors. Recreation Therapy Intern played calming flute music. Patients were to follow along as script was read to engage in the activity.  Education:Stress Management, Discharge Planning.   Education Outcome:Acknowledges edcuation  Clinical Observations/Feedback:Pt did not attend group.  Rachel Meyer, Recreation Therapy Intern  Promiss Labarbera, LRT/CTRS 

## 2017-06-01 NOTE — [facility] Group Notes (Signed)
[facility] 06/01/2017 1:15pm  Type of Therapy: Mental Health Association Presentation  Participation Level: Active  Participation Quality: Attentive  Affect: Appropriate  Cognitive: Oriented  Insight: Developing/Improving  Engagement in Therapy: Engaged  Modes of Intervention: Discussion, Education and Socialization  Summary of Progress/Problems: Mental Health Association (MHA) Speaker came to talk about his personal journey with living with a mental health diagnosis.The pt processed ways by which to relate to the speaker. MHA speaker provided handouts and educational information pertaining to groups and services offered by the [facility]. Pt was engaged in speaker's presentation and was receptive to resources provided.    Vito BackersLynn B. Beverely PaceBryant, MSW, [facility] PointCSWA 06/01/2017 3:33 PM

## 2017-06-01 NOTE — Progress Notes (Signed)
Patient ID: Caleb FeilJames W Grant, male   DOB: 01/14/1971, 46 y.o.   MRN: [id number]  .Pt currently presents with a flat affect and guarded behavior. Pt does not make eye contact during conversation, responds minimally to questions. Pt reports "alright" sleep without any current medication regimen. Pt states "I do not want to take any medication besides what I am taking right now." Pt requests a towel and washcloths to bathe.   Pt provided with medications per providers orders. Pt's labs and vitals were monitored throughout the night. Pt given a 1:1 about emotional and mental status. Pt supported and encouraged to express concerns and questions.   Pt's safety ensured with 15 minute and environmental checks. Pt currently denies SI/HI and A/V hallucinations. Pt verbally agrees to seek staff if SI/HI or A/VH occurs and to consult with staff before acting on any harmful thoughts. Will continue POC.

## 2017-06-01 NOTE — [facility] Suicide Risk Assessment (Signed)
[facility] INPATIENT:  Family/Significant Other Suicide Prevention Education  Suicide Prevention Education:  Education Completed; Caleb Grant [phone](fiance [phone]), has been identified by the patient as the family member/significant other with whom the patient will be residing, and identified as the person(s) who will aid the patient in the event of a mental health crisis (suicidal ideations/suicide attempt).  With written consent from the patient, the family member/significant other has been provided the following suicide prevention education, prior to the and/or following the discharge of the patient.  The suicide prevention education provided includes the following:  Suicide risk factors  Suicide prevention and interventions  National Suicide Hotline telephone number  [facility] assessment telephone number  [facility] Assistance 911  [facility] and/or Residential Mobile Crisis Unit telephone number  Request made of family/significant other to:  Remove weapons (e.g., guns, rifles, knives), all items previously/currently identified as safety concern.    Remove drugs/medications (over-the-counter, prescriptions, illicit drugs), all items previously/currently identified as a safety concern.  The family member/significant other verbalizes understanding of the suicide prevention education information provided.  The family member/significant other agrees to remove the items of safety concern listed above.  Caleb JordanLynn B Mariadelcarmen Grant, MSW, LCSWA 06/01/2017, 3:36 PM

## 2017-06-01 NOTE — B[facility] Counselor (Signed)
Adult Comprehensive Assessment  Patient ID: Caleb Grant, male   DOB: 12/19/1970, 46 y.o.   MRN: [id number]  Information Source: Information source: Patient  Current Stressors:  Educational / Learning stressors: 9th grade education  Employment / Job issues: Pt is concerned that he's missing time from work due to being in the hospital  Family Relationships: None reported  Surveyor, quantityinancial / Lack of resources (include bankruptcy): Pt's son overdrafted his account recently  Housing / Lack of housing: None reported  Physical health (include injuries & life threatening diseases): None reported  Social relationships: None reported  Substance abuse: Pt denies use  Bereavement / Loss: Pt's sister died 3 years ago  Living/Environment/Situation:  Living Arrangements: Spouse/significant other, Other relatives Living conditions (as described by patient or guardian): Pt lives in a home with his fiance and her son  How long has patient lived in current situation?: About a year What is atmosphere in current home: Comfortable  Family History:  Marital status: Long term relationship Long term relationship, how long?: 8 years  What types of issues is patient dealing with in the relationship?: "Nothing really, just normal every day relationship stuff" Does patient have children?: Yes How many children?: 3 How is patient's relationship with their children?: Close relationship with his kids, pt states he sees them every day and talks to them every day   Childhood History:  By whom was/is the patient raised?: Both parents Additional childhood history information: Pt describes his childhood as "good" Description of patient's relationship with caregiver when they were a child: Good relationship with parents  Patient's description of current relationship with people who raised him/her: Both parents are deceased  How were you disciplined when you got in trouble as a child/adolescent?: Spankings  Does patient  have siblings?: Yes Number of Siblings: 2 Description of patient's current relationship with siblings: Close relationship with his two living siblings. Pt has a sister that died when she was 46 yo and another sister that died 3 years ago. 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  How long has patient been employed?: A little over a year Patient's job has been impacted by current illness: No What is the longest time patient has a held a job?: 10-15 years  Where was the patient employed at that time?: [employer] Has patient ever been in the [employer] and Companymilitary?: No Has patient ever served in combat?: No Did You Receive Any Psychiatric Treatment/Services While in Equities traderthe Military?:  (NA) Are There Guns or Other Weapons in Your Home?: No Are These ComptrollerWeapons Safely Secured?:  (NA)  Financial Resources:   Financial resources: Income from employment  Alcohol/Substance Abuse:   What has been your use of drugs/alcohol within the last 12 months?: Occasional drinking, no drug use  Alcohol/Substance Abuse Treatment Hx: Denies past history  Social Support System:   Forensic psychologistatient's Community Support System: Good Describe Community Support System: Fiance, kids  Type of faith/religion: None  How does patient's faith help to cope with current  illness?: NA  Leisure/Recreation:   Leisure and Hobbies: Fishing, walking, spending time with his  family   Strengths/Needs:   What things does the patient do well?: "I don't know" In what areas does patient struggle / problems for patient: "I ain't never thought about it so I couldn't really tell you"  Discharge Plan:   Does patient have access to transportation?: Yes (Pt's fiance will transport ) Will patient be returning to same living situation after discharge?: Yes Currently receiving community mental health services: No If no, would patient like referral for services when discharged?: Yes (What county?) Medical laboratory scientific officer([place]) Does patient have financial barriers related to discharge medications?: No  Summary/Recommendations:     Patient is a 46 yo male who presented to the hospital with after an intentional overdose. Pt's primary diagnosis is Major Depressive Disorder. Primary triggers for admission include financial stress and an argument with his fiance. During the time of the assessment pt was alert and oriented, pleasant, and forthcoming with information. Pt is agreeable to continuing treatment on an outpatient basis upon discharge. Pt's supports include his fiance, and his children. Patient will benefit from crisis stabilization, medication evaluation, group therapy and pyschoeducation, in addition to case management for discharge planning. At discharge, it is recommended that pt remain compliant with the established discharge plan and continue treatment.   Caleb Grant, MSW, Theresia MajorsLCSWA 06/01/2017

## 2017-06-01 NOTE — [facility] Suicide Risk Assessment (Signed)
[facility]   Nursing information obtained from:  Patient Demographic factors:  Male, Caucasian Current Mental Status:  Suicidal ideation indicated by patient, Suicide plan, Plan includes specific time, place, or method, Self-harm thoughts, Self-harm behaviors, Intention to act on suicide plan, Belief that plan would result in death Loss Factors:  Financial problems / change in socioeconomic status Historical Factors:  Impulsivity Risk Reduction Factors:  Responsible for children under 48 years of age, Employed, Living with another person, especially a relative, Positive social support  Total Time spent with patient: 45 minutes Principal Problem: Depression Diagnosis:   Patient Active Problem List   Diagnosis Date Noted  . Depression [F32.9] 05/31/2017  . Alcoholic intoxication without complication (HCC) [F10.920]   . Hypotension [I95.9]   . Intentional opiate overdose (HCC) [T40.602A] 05/29/2017  . Narcotic overdose [T40.601A] 05/29/2017  . CAD (coronary artery disease), native coronary artery- s/p PCI + DES to 2nd Mrg 04/2016, nl LVEF 50-55% [I25.10] 08/05/2016  . HLD (hyperlipidemia) [E78.5] 08/05/2016  . HTN (hypertension) [I10] 08/05/2016  . NSTEMI (non-ST elevated myocardial infarction) (HCC) [I21.4] 04/28/2016  . Tobacco abuse [Z72.0]   . NSVT (nonsustained ventricular tachycardia) (HCC) [I47.2]     Continued Clinical Symptoms:  Alcohol Use Disorder Identification Test Final Score (AUDIT): 4 The "Alcohol Use Disorders Identification Test", Guidelines for Use in Primary Care, Second Edition.  World Science writerHealth Organization [facility](WHO). Score between 0-7:  no or low risk or alcohol related problems. Score between 8-15:  moderate risk of alcohol related problems. Score between 16-19:  high risk of alcohol related problems. Score 20 or above:  warrants further diagnostic evaluation for alcohol dependence and treatment.   CLINICAL FACTORS:  46 year old single  male, lives with fiance, has three adult children,employed ,works in car transportation.  Patient reports impulsively overdosing on Oxycodone , which had recently prescribed for back pain following a fall.  As per chart notes, he took about 40 tablets and required intervention with Narcan. Patient minimizes depression , and states he was not feeling suicidal prior to event . States he had been arguing with one of his sons and with his fiance " about money and bills". States overdose was completely unplanned and impulsive.  He reports he drinks 2-3 x per week, and acknowledges he was drinking on day of admission. Admission BAL 160, Admission UDS positive for opiates . Regarding overdose states " I immediately regretted it". States " after I took them I was out, I am assuming my fiance called 911 . This event occurred 7/22.  Denies neuro vegetative symptoms prior to event- sleep good , energy good,  appetite is good,denies anhedonia, denies sadness   Denies any prior history of suicide attempts, denies history or self cutting, denies history of severe depressive episodes in the past, denies history of mania, denies history of psychosis, denies history of agoraphobia. States he has never been on psychiatric medications.  Denies history of alcohol or drug abuse, states he drinks 2-3 x per week.   Medical History is remarkable for CAD, Stented one year ago, HTN.   Parents deceased, both passed away from cancer . Has two surviving siblings, one sister died from cancer, and one sister died in MVA  Dx- Suicide attempt by overdose   Plan- inpatient admission. Patient is not currently on any standing psychiatric medications, states " honestly I don't need to be on any, I am not depressed ".        Musculoskeletal: Strength & Muscle  Tone: within normal limits Gait & Station: normal Patient leans: N/A  Psychiatric Specialty Exam: Physical Exam  ROS denies headache, no chest pain, no shortness of  breath, no nausea, no vomiting   Blood pressure (!) 152/94, pulse 69, temperature 97.8 F (36.6 C), temperature source Oral, resp. rate 16, height 5\' 7"  (1.702 m), weight 75.3 kg (166 lb), SpO2 99 %.Body mass index is 26 kg/m.  General Appearance: Fairly Groomed  Eye Contact:  Fair  Speech:  Normal Rate  Volume:  Normal  Mood:  denies depression , states mood is "OK"  Affect:  appropriate, reactive, mildly anxious   Thought Process:  Linear and Descriptions of Associations: Intact  Orientation:  Full (Time, Place, and Person)  Thought Content:  denies hallucinations, no delusions, not internally preoccupied   Suicidal Thoughts:  No denies suicidal or self injurious ideations, denies homicidal or violent ideations   Homicidal Thoughts:  No  Memory:  recent and remote grossly intact   Judgement:  Other:  fair- improving   Insight:  fair- improving   Psychomotor Activity:  Normal  Concentration:  Concentration: Good and Attention Span: Good  Recall:  Good  Fund of Knowledge:  Good  Language:  Good  Akathisia:  Negative  Handed:  Right  AIMS (if indicated):     Assets:  Communication Skills Desire for Improvement Resilience  ADL's:  Intact  Cognition:  WNL  Sleep:  Number of Hours: 5.5      COGNITIVE FEATURES THAT CONTRIBUTE TO RISK:  Closed-mindedness and Loss of executive function    SUICIDE RISK:   Mild:  Suicidal ideation of limited frequency, intensity, duration, and specificity.  There are no identifiable plans, no associated intent, mild dysphoria and related symptoms, good self-control (both objective and subjective assessment), few other risk factors, and identifiable protective factors, including available and accessible social support.  PLAN OF CARE: Patient will be admitted to inpatient psychiatric unit for stabilization and safety. Will provide and encourage milieu participation. Provide medication management and maked adjustments as needed.  Will follow daily.     I certify that inpatient services furnished can reasonably be expected to improve the patient's condition.   Craige CottaFernando A Cobos, MD 06/01/2017, 3:08 PM

## 2017-06-01 NOTE — H&P (Signed)
Psychiatric Admission Assessment Adult  Patient Identification: Caleb Grant MRN:  [id number] Date of Evaluation:  06/01/2017 Chief Complaint:  MDD SINGLE EPISODE SEVERE WITHOUT PSYCHOSIS FEATURES Principal Diagnosis: Depression Diagnosis:   Patient Active Problem List   Diagnosis Date Noted  . Depression [F32.9] 05/31/2017  . Alcoholic intoxication without complication ([place]) [Z36.644]   . Hypotension [I95.9]   . Intentional opiate overdose ([place]) [T40.602A] 05/29/2017  . Narcotic overdose [T40.601A] 05/29/2017  . CAD (coronary artery disease), native coronary artery- s/p PCI + DES to 2nd Mrg 04/2016, nl LVEF 50-55% [I25.10] 08/05/2016  . HLD (hyperlipidemia) [E78.5] 08/05/2016  . HTN (hypertension) [I10] 08/05/2016  . NSTEMI (non-ST elevated myocardial infarction) ([place]) [I21.4] 04/28/2016  . Tobacco abuse [Z72.0]   . NSVT (nonsustained ventricular tachycardia) ([facility]) [I47.2]    History of Present Illness: 46 y.o. Male had presented to [facility] due to suicide attempt by overdosing on Oxycodone while intoxicated. He was admitted to the hospital due to his overdose of taking a"a handful of oxycodones." He reports that he has never done anything like this before and has never had any depression. He states that the stress of life and finances got to him, but the trigger was his child overdrafting his account. He states that his money goes into his son's account and his son spent the money. He stated that he had about 3-4 shots of liquor and then took the pills. He states "It was a big mistake and I shouldn't have done it. I need to get back home and go to work. I have been in the hospital for a few days now." Patient continuously denies depression, anxiety, SI/HI/AVH. He does not want any medications, only to be set-up with outpatient therapy to assist with coping skills.  Associated Signs/Symptoms: Depression Symptoms:  suicidal attempt, (Hypo) Manic Symptoms:  None reported Anxiety Symptoms:  None  reported Psychotic Symptoms:  None reported PTSD Symptoms: NA Total Time spent with patient: 45 minutes  Past Psychiatric History: None reported  Is the patient at risk to self? No.  Has the patient been a risk to self in the past 6 months? Yes.    Has the patient been a risk to self within the distant past? No.  Is the patient a risk to others? No.  Has the patient been a risk to others in the past 6 months? No.  Has the patient been a risk to others within the distant past? No.   Prior Inpatient Therapy:   Prior Outpatient Therapy:    Alcohol Screening: 1. How often do you have a drink containing alcohol?: 4 or more times a week 2. How many drinks containing alcohol do you have on a typical day when you are drinking?: 1 or 2 3. How often do you have six or more drinks on one occasion?: Never Preliminary Score: 0 9. Have you or someone else been injured as a result of your drinking?: No 10. Has a relative or friend or a doctor or another health worker been concerned about your drinking or suggested you cut down?: No Alcohol Use Disorder Identification Test Final Score (AUDIT): 4 Brief Intervention: AUDIT score less than 7 or less-screening does not suggest unhealthy drinking-brief intervention not indicated Substance Abuse History in the last 12 months:  Yes.   Consequences of Substance Abuse: Patient abused ETOH but states that it is not normal and only happened due to stress Previous Psychotropic Medications: No  Psychological Evaluations: No  Past Medical History:  Past Medical  History:  Diagnosis Date  . Back pain   . CAD (coronary artery disease)    a. 04/2016: NSTEMI and NSVT: Cath showing 100% stenosis 2nd Mrg (PCI w/ DES), 20% stenosis mid-LAD, 40% distal LAD, and 20% proxRCA   . Tobacco abuse   . Ventricular bigeminy    a. noted during 04/2016 admission. Started on BB.    Past Surgical History:  Procedure Laterality Date  . CARDIAC CATHETERIZATION N/A 04/28/2016    Procedure: Left Heart Cath and Coronary Angiography;  Surgeon: Burnell Blanks, MD;  Location: [facility];  Service: Cardiovascular;  Laterality: N/A;  . CARDIAC CATHETERIZATION N/A 04/28/2016   Procedure: Coronary Stent Intervention;  Surgeon: Burnell Blanks, MD;  Location: [place];  Service: Cardiovascular;  Laterality: N/A;   Family History:  Family History  Problem Relation Age of Onset  . Coronary artery disease Father   . Coronary artery disease Paternal Grandfather   . Heart disease Sister   . Heart failure Sister    Family Psychiatric  History: None reported Tobacco Screening: Have you used any form of tobacco in the last 30 days? (Cigarettes, Smokeless Tobacco, Cigars, and/or Pipes): Yes Tobacco use, Select all that apply: 5 or more cigarettes per day Are you interested in Tobacco Cessation Medications?: Yes, will notify MD for an order Counseled patient on smoking cessation including recognizing danger situations, developing coping skills and basic information about quitting provided: Yes Social History:  History  Alcohol Use No     History  Drug Use No    Additional Social History:  Allergies:  No Known Allergies Lab Results:  Results for orders placed or performed during the hospital encounter of 05/29/17 (from the past 48 hour(s))  Comprehensive metabolic panel     Status: Abnormal   Collection Time: 05/30/17 12:28 PM  Result Value Ref Range   Sodium 138 135 - 145 mmol/L   Potassium 4.0 3.5 - 5.1 mmol/L   Chloride 107 101 - 111 mmol/L   CO2 26 22 - 32 mmol/L   Glucose, Bld 144 (H) 65 - 99 mg/dL   BUN 9 6 - 20 mg/dL   Creatinine, Ser 1.18 0.61 - 1.24 mg/dL   Calcium 7.9 (L) 8.9 - 10.3 mg/dL   Total Protein 5.7 (L) 6.5 - 8.1 g/dL   Albumin 3.1 (L) 3.5 - 5.0 g/dL   AST 25 15 - 41 U/L   ALT 40 17 - 63 U/L   Alkaline Phosphatase 65 38 - 126 U/L   Total Bilirubin 0.9 0.3 - 1.2 mg/dL   GFR calc non Af Amer >60 >60 mL/min   GFR calc  Af Amer >60 >60 mL/min    Comment: (NOTE) The eGFR has been calculated using the CKD EPI equation. This calculation has not been validated in all clinical situations. eGFR's persistently <60 mL/min signify possible Chronic Kidney Disease.    Anion gap 5 5 - 15  CBC     Status: Abnormal   Collection Time: 05/31/17 12:52 PM  Result Value Ref Range   WBC 8.4 4.0 - 10.5 K/uL   RBC 4.30 4.22 - 5.81 MIL/uL   Hemoglobin 14.7 13.0 - 17.0 g/dL   HCT 42.1 39.0 - 52.0 %   MCV 97.9 78.0 - 100.0 fL   MCH 34.2 (H) 26.0 - 34.0 pg   MCHC 34.9 30.0 - 36.0 g/dL   RDW 12.7 11.5 - 15.5 %   Platelets 140 (L) 150 - 400 K/uL  Blood Alcohol level:  Lab Results  Component Value Date   ETH 160 (H) 77/09/6578    Metabolic Disorder Labs:  Lab Results  Component Value Date   HGBA1C 5.1 04/28/2016   MPG 100 04/28/2016   No results found for: PROLACTIN Lab Results  Component Value Date   CHOL 81 (L) 03/04/2017   TRIG 132 03/04/2017   HDL 23 (L) 03/04/2017   CHOLHDL 3.5 03/04/2017   VLDL 26 04/29/2016   LDLCALC 32 03/04/2017   LDLCALC 70 04/29/2016    Current Medications: Current Facility-Administered Medications  Medication Dose Route Frequency Provider Last Rate Last Dose  . acetaminophen (TYLENOL) tablet 650 mg  650 mg Oral Q6H PRN Okonkwo, Justina A, NP      . aspirin EC tablet 81 mg  81 mg Oral Daily Okonkwo, Justina A, NP   81 mg at 06/01/17 0854  . atorvastatin (LIPITOR) tablet 40 mg  40 mg Oral q1800 Okonkwo, Justina A, NP      . metoprolol tartrate (LOPRESSOR) tablet 12.5 mg  12.5 mg Oral BID Okonkwo, Justina A, NP   12.5 mg at 06/01/17 0854  . nicotine (NICODERM CQ - dosed in mg/24 hours) patch 21 mg  21 mg Transdermal Daily Okonkwo, Justina A, NP   21 mg at 06/01/17 0857  . predniSONE (DELTASONE) tablet 30 mg  30 mg Oral Q breakfast Okonkwo, Justina A, NP   30 mg at 06/01/17 0383   PTA Medications: Prescriptions Prior to Admission  Medication Sig Dispense Refill Last Dose  .  amoxicillin-clavulanate (AUGMENTIN) 875-125 MG tablet Take 1 tablet by mouth 2 (two) times daily. 28 tablet 0   . aspirin EC 81 MG EC tablet Take 1 tablet (81 mg total) by mouth daily.   Taking  . atorvastatin (LIPITOR) 40 MG tablet TAKE 1 TABLET BY MOUTH EVERY DAY 90 tablet 3   . folic acid (FOLVITE) 1 MG tablet Take 1 tablet (1 mg total) by mouth daily. 30 tablet 1   . metoprolol tartrate (LOPRESSOR) 25 MG tablet Take 0.5 tablets (12.5 mg total) by mouth 2 (two) times daily. 180 tablet 3   . nicotine (NICODERM CQ - DOSED IN MG/24 HOURS) 21 mg/24hr patch Place 1 patch (21 mg total) onto the skin daily. 28 patch 0   . nitroGLYCERIN (NITROSTAT) 0.4 MG SL tablet Place 1 tablet (0.4 mg total) under the tongue every 5 (five) minutes x 3 doses as needed for chest pain. 25 tablet 6   . predniSONE (DELTASONE) 50 MG tablet Take 1 tablet (50 mg total) by mouth daily with breakfast. 5 tablet 0   . sildenafil (VIAGRA) 50 MG tablet Take 1 tablet (50 mg total) by mouth daily as needed for erectile dysfunction. 15 tablet 6   . thiamine 100 MG tablet Take 1 tablet (100 mg total) by mouth daily. 30 tablet 1     Musculoskeletal: Strength & Muscle Tone: within normal limits Gait & Station: normal Patient leans: N/A  Psychiatric Specialty Exam: Physical Exam  Nursing note and vitals reviewed. Constitutional: He is oriented to person, place, and time. He appears well-developed and well-nourished.  Cardiovascular: Normal rate.   Respiratory: Effort normal.  Musculoskeletal: Normal range of motion.  Neurological: He is alert and oriented to person, place, and time.  Skin: Skin is warm.    Review of Systems  Constitutional: Negative.   HENT: Negative.   Eyes: Negative.   Respiratory: Negative.   Cardiovascular: Negative.   Gastrointestinal: Negative.  Genitourinary: Negative.   Musculoskeletal: Negative.   Skin: Negative.   Neurological: Negative.   Endo/Heme/Allergies: Negative.     Blood pressure  (!) 152/94, pulse 69, temperature 97.8 F (36.6 C), temperature source Oral, resp. rate 16, height 5' 7" (1.702 m), weight 75.3 kg (166 lb), SpO2 99 %.Body mass index is 26 kg/m.  General Appearance: Casual  Eye Contact:  Good  Speech:  Clear and Coherent  Volume:  Normal  Mood:  Euthymic and Irritable  Affect:  Flat  Thought Process:  Coherent and Descriptions of Associations: Intact  Orientation:  Full (Time, Place, and Person)  Thought Content:  WDL and Logical  Suicidal Thoughts:  No  Homicidal Thoughts:  No  Memory:  Immediate;   Good Recent;   Good  Judgement:  Good  Insight:  Good  Psychomotor Activity:  Normal  Concentration:  Concentration: Good and Attention Span: Good  Recall:  Good  Fund of Knowledge:  Good  Language:  Good  Akathisia:  No  Handed:  Right  AIMS (if indicated):     Assets:  Desire for Improvement Financial Resources/Insurance Housing Social Support Transportation  ADL's:  Intact  Cognition:  WNL  Sleep:  Number of Hours: 5.5    Treatment Plan Summary: Daily contact with patient to assess and evaluate symptoms and progress in treatment, Medication management and Plan is to:  -No medications at this time -Encourage group therapy participation   Observation Level/Precautions:  15 minute checks  Laboratory:  CBC Chemistry Profile UDS UA Reviewed  Psychotherapy:  Group therapy  Medications:  See [facility]  Consultations:  As needed  Discharge Concerns:  Follow up  Estimated LOS: 3-5 days  Other:  Admit to [facility] for Primary Diagnosis: Depression Long Term Goal(s): Improvement in symptoms so as ready for discharge  Short Term Goals: Ability to disclose and discuss suicidal ideas  Physician Treatment Plan for Secondary Diagnosis: Principal Problem:   Depression Active Problems:   Tobacco abuse   Intentional opiate overdose ([place])  Long Term Goal(s): Improvement in symptoms so as ready for discharge  Short  Term Goals: Ability to verbalize feelings will improve and Ability to demonstrate self-control will improve  I certify that inpatient services furnished can reasonably be expected to improve the patient's condition.    Lewis Shock, FNP 7/25/201811:22 AM  I have discussed case with NP and have met with patient Agree with NP assessment  69 year old single male, lives with fiance, has three adult children,employed ,works in car transportation.  Patient reports impulsively overdosing on Oxycodone , which had recently prescribed for back pain following a fall.  As per chart notes, he took about 40 tablets and required intervention with Narcan. Patient minimizes depression , and states he was not feeling suicidal prior to event . States he had been arguing with one of his sons and with his fiance " about money and Grant". States overdose was completely unplanned and impulsive.  He reports he drinks 2-3 x per week, and acknowledges he was drinking on day of admission. Admission BAL 160, Admission UDS positive for opiates . Regarding overdose states " I immediately regretted it". States " after I took them I was out, I am assuming my fiance called 911 . This event occurred 7/22.  Denies neuro vegetative symptoms prior to event- sleep good , energy good,  appetite is good,denies anhedonia, denies sadness   Denies any prior history of suicide attempts, denies history  or self cutting, denies history of severe depressive episodes in the past, denies history of mania, denies history of psychosis, denies history of agoraphobia. States he has never been on psychiatric medications.  Denies history of alcohol or drug abuse, states he drinks 2-3 x per week.   Medical History is remarkable for CAD, Stented one year ago, HTN.   Parents deceased, both passed away from cancer . Has two surviving siblings, one sister died from cancer, and one sister died in MVA  Dx- Suicide attempt by overdose   Plan-  inpatient admission. Patient is not currently on any standing psychiatric medications, states " honestly I don't need to be on any, I am not depressed ".

## 2017-06-01 NOTE — Progress Notes (Signed)
D: Patient is quiet and isolative.  He denies any depressive symptoms or thoughts of self harm.  His goal is to "go home and see my grandkids."  He is sleeping and eating well.  He is isolative to his room at times.  He is observed in the day room watching TV with his peers.  Patient's energy level is normal; his concentration is good.  Patient is pleasant with staff.  Patient is concerned about missing work while in the hospital. A: Continue to monitor medication management and MD orders.  Safety checks continued every 15 minutes per protocol.  Offer support and encouragement as needed. R: Patient is receptive to staff; his behavior is appropriate.

## 2017-06-02 NOTE — Progress Notes (Addendum)
D:  Patient's self inventory sheet, patient sleeps good, no sleep medication given.  Good appetite, normal energy level, good concentration.  Denied depression, hopeless and anxiety.  Denied withdrawals.  Denied SI.  Denied physical problems.  Denied pain.  Goal is discharge.  Does have discharge plans.  Plans to spend time with family, friends and grandkids. A:  Medications administered per MD orders.  Emotional support and encouragement given patient. R:  Denied SI and HI, contracts for safety.  Denied A/V hallucinations.  Safety maintained with 15 minute checks.

## 2017-06-02 NOTE — [facility] Group Notes (Signed)

## 2017-06-02 NOTE — [facility] Group Notes (Signed)
[facility] LCSW Group Therapy 06/02/2017 1:15pm  Type of Therapy: Group Therapy- Balance in Life  Participation Level: Active   Description of the Group:  The topic for group was balance in life. Today's group focused on defining balance in one's own words, identifying things that can knock one off balance, and exploring healthy ways to maintain balance in life. Group members were asked to provide an example of a time when they felt off balance, describe how they handled that situation,and process healthier ways to regain balance in the future. Group members were asked to share the most important tool for maintaining balance [facility] and how they plan to apply this method after discharge.  Summary of Patient Progress Pt reports that he is feeling balanced today and he is ready for discharge. Pt states that he is tired of being in the hospital and had been ready to go since the first day he got here.    Therapeutic Modalities:   Cognitive Behavioral Therapy Solution-Focused Therapy Assertiveness Training   Donnelly StagerLynn Fidela Cieslak, MSW, [facility] 06/02/2017 3:09 PM

## 2017-06-02 NOTE — Progress Notes (Signed)
Patient ID: Caleb Grant, male   DOB: 10/01/1971, 46 y.o.   MRN: [id number]  Pt currently presents with a flat affect and anxious behavior. Pt reports to writer that their goal is to "get through the day." Pt states "the doctor said that if nothing changes I can leave tomrorow." Pt reports good sleep without any current medications. Denies any concerns.   Pt provided with medications per providers orders. Pt's labs and vitals were monitored throughout the night. Pt given a 1:1 about emotional and mental status. Pt supported and encouraged to express concerns and questions. Pt educated on medications.  Pt's safety ensured with 15 minute and environmental checks. Pt currently denies SI/HI and A/V hallucinations. Pt verbally agrees to seek staff if SI/HI or A/VH occurs and to consult with staff before acting on any harmful thoughts. Will continue POC.
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
## 2017-06-02 NOTE — Progress Notes (Signed)
Patient did not attend group.

## 2017-06-02 NOTE — Plan of Care (Addendum)
Problem: Education: Goal: Emotional status will improve Outcome: Progressing Nurse discussed depression/coping skills with patient.    

## 2017-06-03 MED ORDER — NICOTINE 21 MG/24HR TD PT24: 21.0000 mg | MEDICATED_PATCH | Freq: Every day | TRANSDERMAL | 0 refills | Status: DC
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
## 2017-06-03 NOTE — Progress Notes (Signed)
  [facility] Discharge [facility] :  Will you be returning to the same living situation after discharge:  Yes,  pt returning home At discharge, do you have transportation home?: Yes,  pt's fiance will transport. Do you have the ability to pay for your medications: Yes,  pt has insurance  Release of information consent forms completed and in the chart;  Patient's signature needed at discharge.  Patient to Follow up at: Follow-up Information    [facility] Follow up on 06/13/2017.   Specialty:  Behavioral Health Why:  Therapy appointment 8/6 at 4pm with [facility]. Please arrive by 3pm to fill out paperwork. Please bring a photo ID, insurance card, and social security card to your appointment. Thank you Contact information: [address] [place] [id number] [phone]          Next level of care provider [facility] Link:yes  Safety Planning and Suicide Prevention discussed: Yes,  with pt and with pt's fiance  Have you used any form of tobacco in the last 30 days? (Cigarettes, Smokeless Tobacco, Cigars, and/or Pipes): Yes  Has patient been referred to the Quitline?: Patient refused referral  Patient has been referred for addiction treatment: Yes   Pt has declined medication management follow-up and requested therapy follow-up only.  Jonathon JordanLynn B Gracin Soohoo, MSW, LCSWA 06/03/2017, 9:30 AM

## 2017-06-03 NOTE — Progress Notes (Signed)
Data. Patient denies SI/HI/AVH.   Patient interacting well with staff and other patients.  Action. Emotional support and encouragement offered. Education provided on medication, indications and side effect. Q 15 minute checks done for safety. Response. Safety on the unit maintained through 15 minute checks.  Medications taken as prescribed. Attended groups. Remained calm and appropriate through out shift.  Pt. discharged to lobby.  Belongings sheet reviewed and signed by pt. and all belongings sent home. Paperwork reviewed and pt. able to verbalize understanding of education. Pt. in no current distress and ambulatory. 

## 2017-06-03 NOTE — Progress Notes (Signed)
Recreation Therapy Notes  Date: 06/03/2017 Time: 9:30am Location: 300 [address] Dayroom  Group Topic: Stress Management  Goal Area(s) Addresses:  Patient will verbalize importance of using healthy stress management.  Patient will identify positive emotions associated with healthy stress management.   Intervention: Stress Management  Activity : Self-Esteem Meditation. Recreation Therapy Intern introduced the stress management technique of meditation. Recreation Therapy Intern read a script that allowed patients to focus on boosting their self-esteem. Recreation Therapy Intern played calming music. Patients were to follow along as script was read to engage in the activity.  Education: Stress Management, Discharge Planning.   Education Outcome: Needs additional education  Clinical Observations/Feedback: Pt did not attend group.  Rachel Meyer, Recreation Therapy Intern  Trendon Zaring, LRT/CTRS 

## 2017-06-03 NOTE — Tx Team (Signed)
Interdisciplinary Treatment and Diagnostic Plan Update 06/03/2017 Time of Session: 9:30am  Caleb Grant  MRN: [id number]  Principal Diagnosis: Depression  Secondary Diagnoses: Principal Problem:   Depression Active Problems:   Tobacco abuse   Intentional opiate overdose (HCC)   Current Medications:  Current Facility-Administered Medications  Medication Dose Route Frequency Provider Last Rate Last Dose  . acetaminophen (TYLENOL) tablet 650 mg  650 mg Oral Q6H PRN Okonkwo, Justina A, NP      . aspirin EC tablet 81 mg  81 mg Oral Daily Okonkwo, Justina A, NP   81 mg at 06/03/17 0824  . atorvastatin (LIPITOR) tablet 40 mg  40 mg Oral q1800 Okonkwo, Justina A, NP   40 mg at 06/02/17 1830  . metoprolol tartrate (LOPRESSOR) tablet 12.5 mg  12.5 mg Oral BID Okonkwo, Justina A, NP   12.5 mg at 06/03/17 0824  . nicotine (NICODERM CQ - dosed in mg/24 hours) patch 21 mg  21 mg Transdermal Daily Okonkwo, Justina A, NP   21 mg at 06/03/17 [id number]    PTA Medications: Prescriptions Prior to Admission  Medication Sig Dispense Refill Last Dose  . aspirin EC 81 MG EC tablet Take 1 tablet (81 mg total) by mouth daily.   Taking  . atorvastatin (LIPITOR) 40 MG tablet TAKE 1 TABLET BY MOUTH EVERY DAY 90 tablet 3   . metoprolol tartrate (LOPRESSOR) 25 MG tablet Take 0.5 tablets (12.5 mg total) by mouth 2 (two) times daily. 180 tablet 3   . nitroGLYCERIN (NITROSTAT) 0.4 MG SL tablet Place 1 tablet (0.4 mg total) under the tongue every 5 (five) minutes x 3 doses as needed for chest pain. 25 tablet 6   . predniSONE (DELTASONE) 50 MG tablet Take 1 tablet (50 mg total) by mouth daily with breakfast. (Patient taking differently: Take 50 mg by mouth daily with breakfast. Patient stated tapering dose by 1 tablet and has 2 days left) 5 tablet 0     Treatment Modalities: Medication Management, Group therapy, Case management,  1 to 1 session with clinician, Psychoeducation, Recreational therapy.  Patient  Stressors: Financial difficulties Marital or family conflict Patient Strengths: Ability for insight Capable of independent living Barrister's clerkCommunication skills Motivation for treatment/growth Supportive family/friends  Physician Treatment Plan for Primary Diagnosis: Depression Long Term Goal(s): Improvement in symptoms so as ready for discharge Short Term Goals: Ability to disclose and discuss suicidal ideas Ability to verbalize feelings will improve Ability to demonstrate self-control will improve  Medication Management: Evaluate patient's response, side effects, and tolerance of medication regimen.  Therapeutic Interventions: 1 to 1 sessions, Unit Group sessions and Medication administration.  Evaluation of Outcomes: Adequate for Discharge  Physician Treatment Plan for Secondary Diagnosis: Principal Problem:   Depression Active Problems:   Tobacco abuse   Intentional opiate overdose (HCC)  Long Term Goal(s): Improvement in symptoms so as ready for discharge  Short Term Goals: Ability to disclose and discuss suicidal ideas Ability to verbalize feelings will improve Ability to demonstrate self-control will improve  Medication Management: Evaluate patient's response, side effects, and tolerance of medication regimen.  Therapeutic Interventions: 1 to 1 sessions, Unit Group sessions and Medication administration.  Evaluation of Outcomes: Adequate for Discharge  RN Treatment Plan for Primary Diagnosis: Depression Long Term Goal(s): Knowledge of disease and therapeutic regimen to maintain health will improve  Short Term Goals: Compliance with prescribed medications will improve  Medication Management: RN will administer medications as ordered by provider, will assess and evaluate patient's response and provide  education to patient for prescribed medication. RN will report any adverse and/or side effects to prescribing provider.  Therapeutic Interventions: 1 on 1 counseling sessions,  Psychoeducation, Medication administration, Evaluate responses to treatment, Monitor vital signs and CBGs as ordered, Perform/monitor CIWA, COWS, AIMS and Fall Risk screenings as ordered, Perform wound care treatments as ordered.  Evaluation of Outcomes: Adequate for Discharge  LCSW Treatment Plan for Primary Diagnosis: Depression Long Term Goal(s): Safe transition to appropriate next level of care at discharge, Engage patient in therapeutic group addressing interpersonal concerns. Short Term Goals: Engage patient in aftercare planning with referrals and resources, Increase ability to appropriately verbalize feelings, Increase emotional regulation and Increase skills for wellness and recovery  Therapeutic Interventions: Assess for all discharge needs, 1 to 1 time with Social worker, Explore available resources and support systems, Assess for adequacy in community support network, Educate family and significant other(s) on suicide prevention, Complete Psychosocial Assessment, Interpersonal group therapy.  Evaluation of Outcomes: Adequate for Discharge  Progress in Treatment: Attending groups: Yes Participating in groups: Yes Taking medication as prescribed: Yes, MD continues to assess for medication changes as needed Toleration medication: Yes, no side effects reported at this time Family/Significant other contact made: Yes, pt's fiance contacted Patient understands diagnosis: Limited insight Discussing patient identified problems/goals with staff: Yes Medical problems stabilized or resolved: Yes Denies suicidal/homicidal ideation: Yes  Issues/concerns per patient self-inventory: None Other: N/A  New problem(s) identified: None identified at this time.   New Short Term/Long Term Goal(s): None identified at this time.   Discharge Plan or Barriers: Pt will return home and follow [place] [facility] [place].  Reason for Continuation of Hospitalization:  None identified at this  time   Estimated Length of Stay: 0 days; Pt will likely discharge today 06/03/17  Attendees: Patient: 06/03/2017 8:39 AM  Physician: Dr. Jama Flavorsobos 06/03/2017 8:39 AM  Nursing: Boyd KerbsPenny, RN; [place], RN 06/03/2017 8:39 AM  RN Care Manager:  06/03/2017 8:39 AM  Social Worker: Donnelly StagerLynn Atilano Covelli, LCSWA 06/03/2017 8:39 AM  Recreational Therapist:  06/03/2017 8:39 AM  Other: Armandina StammerAgnes Nwoko, NP 06/03/2017 8:39 AM  Other:  06/03/2017 8:39 AM  Other: 06/03/2017 8:39 AM  Scribe for Treatment Team: Jonathon JordanLynn B Basheer Molchan, MSW,LCSWA 06/03/2017 8:39 AM
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
## 2017-06-03 NOTE — Discharge Summary (Signed)
Physician Discharge Summary Note  Patient:  Caleb Grant is an 46 y.o., male MRN:  [id number] DOB:  09/01/1971 Patient phone:  [phone] (home)  Patient address:   [address] [place] [address],  Total Time spent with patient: 30 minutes  Date of Admission:  05/31/2017 Date of Discharge: 06/03/17   Reason for Admission:  Intentional overdose while intoxicated  Principal Problem: Depression Discharge Diagnoses: Patient Active Problem List   Diagnosis Date Noted  . Depression [F32.9] 05/31/2017  . Alcoholic intoxication without complication (HCC) [F10.920]   . Hypotension [I95.9]   . Intentional opiate overdose (HCC) [T40.602A] 05/29/2017  . Narcotic overdose [T40.601A] 05/29/2017  . CAD (coronary artery disease), native coronary artery- s/p PCI + DES to 2nd Mrg 04/2016, nl LVEF 50-55% [I25.10] 08/05/2016  . HLD (hyperlipidemia) [E78.5] 08/05/2016  . HTN (hypertension) [I10] 08/05/2016  . NSTEMI (non-ST elevated myocardial infarction) (HCC) [I21.4] 04/28/2016  . Tobacco abuse [Z72.0]   . NSVT (nonsustained ventricular tachycardia) (HCC) [I47.2]     Past Psychiatric History: None reported  Past Medical History:  Past Medical History:  Diagnosis Date  . Back pain   . CAD (coronary artery disease)    a. 04/2016: NSTEMI and NSVT: Cath showing 100% stenosis 2nd Mrg (PCI w/ DES), 20% stenosis mid-LAD, 40% distal LAD, and 20% proxRCA   . Tobacco abuse   . Ventricular bigeminy    a. noted during 04/2016 admission. Started on BB.    Past Surgical History:  Procedure Laterality Date  . CARDIAC CATHETERIZATION N/A 04/28/2016   Procedure: Left Heart Cath and Coronary Angiography;  Surgeon: Kathleene Hazel, MD;  [facility] IN[facility] CV LAB;  Service: Cardiovascular;  Laterality: N/A;  . CARDIAC CATHETERIZATION N/A 04/28/2016   Procedure: Coronary Stent Intervention;  Surgeon: Kathleene Hazel, MD;  Location: [facility] INVASIVE CV LAB;  Service: Cardiovascular;   Laterality: N/A;   Family History:  Family History  Problem Relation Age of Onset  . Coronary artery disease Father   . Coronary artery disease Paternal Grandfather   . Heart disease Sister   . Heart failure Sister    Family Psychiatric  History: None reported Social History:  History  Alcohol Use No     History  Drug Use No    Social History   Social History  . Marital status: Married    Spouse name: N/A  . Number of children: N/A  . Years of education: N/A   Social History Main Topics  . Smoking status: Current Every Day Smoker    Packs/day: 2.00    Types: Cigarettes  . Smokeless tobacco: Never Used  . Alcohol use No  . Drug use: No  . Sexual activity: Not Asked   Other Topics Concern  . None   Social History Narrative  . None    Hospital Course: 46 y.o. Male had presented to [facility] due to suicide attempt by overdosing on Oxycodone while intoxicated. He was admitted to the hospital due to his overdose of taking a"a handful of oxycodones." He reports that he has never done anything like this before and has never had any depression. He states that the stress of life and finances got to him, but the trigger was his child overdrafting his account. He states that his money goes into his son's account and his son spent the money. He stated that he had about 3-4 shots of liquor and then took the pills. He states "It was a big mistake and I shouldn't  have done it. I need to get back home and go to work. I have been in the hospital for a few days now."  Patient was not started on any medications for depression. He was monitored and seen in group and has been calm and cooperative. Patient will be set up with outpatient services. Patient will be going home with fiance.   Physical Findings: AIMS: Facial and Oral Movements Muscles of Facial Expression: None, normal Lips and Perioral Area: None, normal Jaw: None, normal Tongue: None, normal,Extremity Movements Upper (arms,  wrists, hands, fingers): None, normal Lower (legs, knees, ankles, toes): None, normal, Trunk Movements Neck, shoulders, hips: None, normal, Overall Severity Severity of abnormal movements (highest score from questions above): None, normal Incapacitation due to abnormal movements: None, normal Patient's awareness of abnormal movements (rate only patient's report): No Awareness, Dental Status Current problems with teeth and/or dentures?: No Does patient usually wear dentures?: No  CIWA:  CIWA-Ar Total: 1 COWS:  COWS Total Score: 2  Musculoskeletal: Strength & Muscle Tone: within normal limits Gait & Station: normal Patient leans: N/A  Psychiatric Specialty Exam: Physical Exam  Nursing note and vitals reviewed. Constitutional: He is oriented to person, place, and time. He appears well-developed and well-nourished.  Cardiovascular: Normal rate.   Respiratory: Effort normal.  Musculoskeletal: Normal range of motion.  Neurological: He is alert and oriented to person, place, and time.  Skin: Skin is warm.    Review of Systems  Constitutional: Negative.   HENT: Negative.   Eyes: Negative.   Respiratory: Negative.   Cardiovascular: Negative.   Gastrointestinal: Negative.   Genitourinary: Negative.   Musculoskeletal: Negative.   Skin: Negative.   Neurological: Negative.   Endo/Heme/Allergies: Negative.     Blood pressure 121/86, pulse 87, temperature 97.9 F (36.6 C), resp. rate 20, height 5\' 7"  (1.702 m), weight 75.3 kg (166 lb), SpO2 99 %.Body mass index is 26 kg/m.  General Appearance: Casual and Fairly Groomed  Eye Contact:  Good  Speech:  Clear and Coherent and Normal Rate  Volume:  Normal  Mood:  Euthymic  Affect:  Appropriate  Thought Process:  Coherent and Descriptions of Associations: Intact  Orientation:  Full (Time, Place, and Person)  Thought Content:  WDL and Logical  Suicidal Thoughts:  No  Homicidal Thoughts:  No  Memory:  Immediate;   Good Recent;   Good   Judgement:  Good  Insight:  Good  Psychomotor Activity:  Normal  Concentration:  Concentration: Good and Attention Span: Good  Recall:  Good  Fund of Knowledge:  Good  Language:  Good  Akathisia:  No  Handed:  Right  AIMS (if indicated):     Assets:  Financial Resources/Insurance Housing Social Support Transportation  ADL's:  Intact  Cognition:  WNL  Sleep:  Number of Hours: 6.75     Have you used any form of tobacco in the last 30 days? (Cigarettes, Smokeless Tobacco, Cigars, and/or Pipes): Yes  Has this patient used any form of tobacco in the last 30 days? (Cigarettes, Smokeless Tobacco, Cigars, and/or Pipes)Yes, A prescription for an FDA-approved tobacco cessation medication was prescribed at discharge   Blood Alcohol level:  Lab Results  Component Value Date   ETH 160 (H) 05/29/2017    Metabolic Disorder Labs:  Lab Results  Component Value Date   HGBA1C 5.1 04/28/2016   MPG 100 04/28/2016   No results found for: PROLACTIN Lab Results  Component Value Date   CHOL 81 (L) 03/04/2017  TRIG 132 03/04/2017   HDL 23 (L) 03/04/2017   CHOLHDL 3.5 03/04/2017   VLDL 26 04/29/2016   LDLCALC 32 03/04/2017   LDLCALC 70 04/29/2016    See Psychiatric Specialty Exam and Suicide Risk Assessment completed by Attending Physician prior to discharge.  Discharge destination:  Home  Is patient on multiple antipsychotic therapies at discharge:  No   Has Patient had three or more failed trials of antipsychotic monotherapy by history:  No  Recommended Plan for Multiple Antipsychotic Therapies: NA   Allergies as of 06/03/2017   No Known Allergies     Medication List    STOP taking these medications   predniSONE 50 MG tablet Commonly known as:  DELTASONE     TAKE these medications     Indication  aspirin 81 MG EC tablet Take 1 tablet (81 mg total) by mouth daily.  Indication:  preventative   atorvastatin 40 MG tablet Commonly known as:  LIPITOR TAKE 1 TABLET BY  MOUTH EVERY DAY  Indication:  High Amount of Fats in the Blood   metoprolol tartrate 25 MG tablet Commonly known as:  LOPRESSOR Take 0.5 tablets (12.5 mg total) by mouth 2 (two) times daily.  Indication:  Post MI   nicotine 21 mg/24hr patch Commonly known as:  NICODERM CQ - dosed in mg/24 hours Place 1 patch (21 mg total) onto the skin daily.  Indication:  Nicotine Addiction   nitroGLYCERIN 0.4 MG SL tablet Commonly known as:  NITROSTAT Place 1 tablet (0.4 mg total) under the tongue every 5 (five) minutes x 3 doses as needed for chest pain.  Indication:  Post MI      Follow-up Information    [facility] Follow up on 06/13/2017.   Specialty:  Behavioral Health Why:  Therapy [facility] 8[facility]6 [facility]pm [facility]. Please arrive by 3pm to fill out paperwork. Please bring a photo ID, insurance card, and social security card to your appointment. Thank you Contact information: [address] [place] [phone]          Follow-up recommendations:  Continue activity as tolerated. Continue diet as recommended by your PCP. Ensure to keep all appointments with outpatient providers.  Comments:  Patient is instructed prior to discharge to: Take all medications as prescribed by his/her mental healthcare provider. Report any adverse effects and or reactions from the medicines to his/her outpatient provider promptly. Patient has been instructed & cautioned: To not engage in alcohol and or illegal drug use while on prescription medicines. In the event of worsening symptoms, patient is instructed to call the crisis hotline, 911 and or go to the nearest ED for appropriate evaluation and treatment of symptoms. To follow-up with his/her primary care provider for your other medical issues, concerns and or health care needs.    Signed: Gerlene Burdockravis B Money, FNP 06/03/2017, 9:43 AM  Patient seen, Suicide Assessment  Completed.  Disposition Plan Reviewed

## 2017-06-09 ENCOUNTER — Telehealth: Payer: Self-pay | Admitting: Cardiovascular Disease

## 2017-06-09 NOTE — Telephone Encounter (Signed)
Caleb Grant is calling because [name] blood pressure has been going up an down and feel like his blood pressure medication should be adjusted . Please call

## 2017-06-09 NOTE — Telephone Encounter (Signed)
Spoke with [place]Caleb Grant who states that the patient has had intermittent episodes of hypertension for the past 2 weeks. She states that his systolics have been as high as 170. She states that he just doesn't feel good. She states that he has been in a lot of pain lately but she feels like he needs to be evaluated to look at changing his medication. She states that he has been taking metoprolol 12.5 mg BID. She states that he has an appointment nearby on Monday and would like for him to be scheduled on Monday. Patient scheduled with Caleb Spiesayna Dunn, PA on Monday at 2:00 PM.

## 2017-06-11 ENCOUNTER — Encounter: Payer: Self-pay | Admitting: Physician Assistant

## 2017-06-11 NOTE — Progress Notes (Signed)
Cardiology Office Note    Date:  06/13/2017  ID:  Caleb Grant, DOB 01/05/1971, MRN [id number] PCP:  Caleb PandaMillsaps, Kimberly, NP  Cardiologist:  Dr. Clifton Grant   Chief Complaint: high blood pressure  History of Present Illness:  Caleb Grant is a 46 y.o. male with history of CAD (NSTEMI 04/2016 s/p DES to OM2,  HTN, residual nonobstructive LAD/RCA disease), tobacco abuse, ventricular bigeminy during 04/2016 admission, erectile dysfunction, hemorrhoids, depression who presents for evaluation of high blood pressure. Echo June 2017 with LVEF=50-55%, posterolateral wall hypokinesis, mild MR. Brilinta was stopped previously due to hemorrhoidal bleeding and he's been managed with aspirin. In July 2018 he was admitted with suicide attempt by overdosing on oxycodone while intoxicated. This was in the setting of stress of life and finances and his son overdrafting his account. Last labs 05/2017 showed Hgb 14.7, plt 140, glucose 144, Cr 1.18, K 4.0, TSH wnl. LDL in 02/2017 was 32.  He returns for follow-up with his fiancee. He has been watching his BP recently and seeing spikes up to the 170 range. He is also dealing with constant back pain related to a bulging disc in his back that is being worked up. He believes this is periodically driving his blood pressure up. He says otherwise he has been doing well recently without any chest pain, SOB, anginal symptoms, syncope, diaphoresis. He has been using nicotine patches in attempts to cut down smoking.  Past Medical History:  Diagnosis Date  . Back pain   . CAD (coronary artery disease)    a. 04/2016: NSTEMI and NSVT: Cath showing 100% stenosis 2nd Mrg (PCI w/ DES), 20% stenosis mid-LAD, 40% distal LAD, and 20% proxRCA   . Essential hypertension   . Suicide attempt (HCC) 05/2017  . Tobacco abuse   . Ventricular bigeminy    a. noted during 04/2016 admission. Started on BB.    Past Surgical History:  Procedure Laterality Date  . CARDIAC CATHETERIZATION N/A  04/28/2016   Procedure: Left Heart Cath and Coronary Angiography;  Surgeon: Caleb Hazelhristopher D McAlhany, MD;  [facility];  Service: Cardiovascular;  Laterality: N/A;  . CARDIAC CATHETERIZATION N/A 04/28/2016   Procedure: Coronary Stent Intervention;  Surgeon: Caleb Hazelhristopher D McAlhany, MD;  Location: [facility] INVASIVE CV LAB;  Service: Cardiovascular;  Laterality: N/A;    Current Medications: Current Meds  Medication Sig  . AMOXICILLIN ER PO Take 1 tablet by mouth 2 (two) times daily.  Marland Kitchen. aspirin EC 81 MG EC tablet Take 1 tablet (81 mg total) by mouth daily.  Marland Kitchen. atorvastatin (LIPITOR) 40 MG tablet TAKE 1 TABLET BY MOUTH EVERY DAY  . Cyanocobalamin (VITAMIN B-12 PO) Take 1 tablet by mouth daily.  . cyclobenzaprine (FLEXERIL) 10 MG tablet Take 10 mg by mouth every 6 (six) hours as needed for muscle spasms.  . folic acid (FOLVITE) 1 MG tablet Take 1 mg by mouth daily.  . metoprolol tartrate (LOPRESSOR) 25 MG tablet Take 0.5 tablets (12.5 mg total) by mouth 2 (two) times daily.  . naproxen (NAPROSYN) 500 MG tablet Take 500 mg by mouth every 6 (six) hours as needed for mild pain, moderate pain or headache.  . nicotine (NICODERM CQ - DOSED IN MG/24 HOURS) 21 mg/24hr patch Place 1 patch (21 mg total) onto the skin daily.  . nitroGLYCERIN (NITROSTAT) 0.4 MG SL tablet Place 1 tablet (0.4 mg total) under the tongue every 5 (five) minutes x 3 doses as needed for chest pain.  . Oxycodone HCl  10 MG TABS Take 10 mg by mouth every 6 (six) hours as needed (pain).  . predniSONE (DELTASONE) 10 MG tablet Take 10 mg by mouth daily with breakfast.     Allergies:   Patient has no known allergies.   Social History   Social History  . Marital status: Married    Spouse name: N/A  . Number of children: N/A  . Years of education: N/A   Social History Main Topics  . Smoking status: Current Every Day Smoker    Packs/day: 2.00    Types: Cigarettes  . Smokeless tobacco: Never Used  . Alcohol use No  . Drug  use: No  . Sexual activity: Not Asked   Other Topics Concern  . None   Social History Narrative  . None     Family History:  Family History  Problem Relation Age of Onset  . Coronary artery disease Father   . Coronary artery disease Paternal Grandfather   . Heart disease Sister   . Heart failure Sister     ROS:   Please see the history of present illness.  All other systems are reviewed and otherwise negative.    PHYSICAL EXAM:   VS:  BP 130/80 (BP Location: Left Arm)   Pulse 83   Ht 5\' 7"  (1.702 m)   Wt 169 lb 3.2 oz (76.7 kg)   BMI 26.50 kg/m   BMI: Body mass index is 26.5 kg/m. GEN: Well nourished, well developed WM, in no acute distress  HEENT: normocephalic, atraumatic Neck: no JVD, carotid bruits, or masses Cardiac: RRR; no murmurs, rubs, or gallops, no edema  Respiratory:  clear to auscultation bilaterally, normal work of breathing GI: soft, nontender, nondistended, + BS MS: no deformity or atrophy  Skin: warm and dry, no rash Neuro:  Alert and Oriented x 3, Strength and sensation are intact, follows commands Psych: euthymic mood, full affect, generally avoids eye contact, no SI/HI  Wt Readings from Last 3 Encounters:  06/13/17 169 lb 3.2 oz (76.7 kg)  05/30/17 174 lb 2.6 oz (79 kg)  03/04/17 166 lb (75.3 kg)      Studies/Labs Reviewed:   EKG:  EKG was ordered today and personally reviewed by me and demonstrates NSR 83bpm, occasional PVCs including couplets, possible LAE, QTc 434ms  Recent Labs: 05/29/2017: TSH 2.120 05/30/2017: ALT 40; BUN 9; Creatinine, Ser 1.18; Potassium 4.0; Sodium 138 05/31/2017: Hemoglobin 14.7; Platelets 140   Lipid Panel    Component Value Date/Time   CHOL 81 (L) 03/04/2017 0839   TRIG 132 03/04/2017 0839   HDL 23 (L) 03/04/2017 0839   CHOLHDL 3.5 03/04/2017 0839   CHOLHDL 5.6 04/29/2016 0232   VLDL 26 04/29/2016 0232   LDLCALC 32 03/04/2017 0839    Additional studies/ records that were reviewed today  include: Summarized above    ASSESSMENT & PLAN:   1. Essential HTN - initial BP 124/64 then 130/80 by tech. BP rechecked by me was 138/80. I do suspect spikes are precipitated by his recent back pain, but he and his wife are also concerned about general trend going up. He was off his metoprolol for a short period of time which also likely contributed. I feel it is prudent to add losartan 25mg  daily to see if we can get a better handle on the variability of his BP. This may also be an ideal med long-term to prevent development of CKD. I would not increase beta blocker at this time due to his depression.  He also has prior HR of 50 documented in visit with Dr. Clifton Stedman. We discussed options of f/u BP check in 2 weeks versus obtaining a home monitor and calling in with results for our review. He elected the latter. 2. CAD - no recent anginal symptoms. Continue ASA, BB, statin. Recent LDL was at goal. Will defer to primary cardiologist regarding any future titration to high dose statin. 3. Tobacco abuse - counseled regarding importance of quitting; congratulated him on the strides he's made thus far. 4. PVCs - asymptomatic, noted on prior tracings. Given prior relatively normal EF and asymptomatic nature, I would recommend to monitor for now. Could consider event monitor in the future to quantify. For now, however, I do not think titration of his beta blocker would be ideal given recent exacerbation of depression.  Disposition: F/u with Dr. Clifton Linus in 6 months.   Medication Adjustments/Labs and Tests Ordered: Current medicines are reviewed at length with the patient today.  Concerns regarding medicines are outlined above. Medication changes, Labs and Tests ordered today are summarized above and listed in the Patient Instructions accessible in Encounters.   Signed, Laurann Montana, PA-C  06/13/2017 2:50 PM    [facility] [address] [place], [place], [place]  [address] Phone: [phone]; Fax: [phone]

## 2017-06-13 ENCOUNTER — Encounter: Payer: Self-pay | Admitting: Physician Assistant

## 2017-06-13 ENCOUNTER — Ambulatory Visit (INDEPENDENT_AMBULATORY_CARE_PROVIDER_SITE_OTHER): Payer: BLUE CROSS/BLUE SHIELD | Admitting: Physician Assistant

## 2017-06-13 ENCOUNTER — Ambulatory Visit (HOSPITAL_COMMUNITY): Payer: BLUE CROSS/BLUE SHIELD | Admitting: Licensed Clinical Social Worker

## 2017-06-13 VITALS — BP 130/80 | HR 83 | Ht 67.0 in | Wt 169.2 lb

## 2017-06-13 DIAGNOSIS — I10 Essential (primary) hypertension: Secondary | ICD-10-CM

## 2017-06-13 DIAGNOSIS — I493 Ventricular premature depolarization: Secondary | ICD-10-CM

## 2017-06-13 DIAGNOSIS — Z720 Tobacco use: Secondary | ICD-10-CM

## 2017-06-13 DIAGNOSIS — I2510 Atherosclerotic heart disease of native coronary artery without angina pectoris: Secondary | ICD-10-CM | POA: Diagnosis not present

## 2017-06-13 MED ORDER — LOSARTAN POTASSIUM 25 MG PO TABS
25.0000 mg | ORAL_TABLET | Freq: Every day | ORAL | 3 refills | Status: DC
Start: 2017-06-13 — End: 2018-08-11

## 2017-06-13 NOTE — Patient Instructions (Signed)
Medication Instructions:  Your physician has recommended you make the following change in your medication: 1.) Start Losartan 25 mg daily.   Labwork: Your physician recommends that you return for lab work in: 7-10 days for a BMET  Testing/Procedures: None Ordered   Follow-Up: Your physician wants you to follow-up in: 6 months with Dr. Clifton JamesMcAlhany. You will receive a reminder letter in the mail two months in advance. If you don't receive a letter, please call our office to schedule the follow-up appointment.   Any Other Special Instructions Will Be Listed Below (If Applicable).     If you need a refill on your cardiac medications before your next appointment, please call your pharmacy.

## 2017-06-23 ENCOUNTER — Other Ambulatory Visit: Payer: Self-pay

## 2017-06-28 ENCOUNTER — Other Ambulatory Visit: Payer: BLUE CROSS/BLUE SHIELD | Admitting: *Deleted

## 2017-06-28 DIAGNOSIS — I10 Essential (primary) hypertension: Secondary | ICD-10-CM

## 2017-06-29 LAB — BASIC METABOLIC PANEL
BUN / CREAT RATIO: 11 (ref 9–20)
BUN: 12 mg/dL (ref 6–24)
CHLORIDE: 103 mmol/L (ref 96–106)
CO2: 24 mmol/L (ref 20–29)
Calcium: 9.3 mg/dL (ref 8.7–10.2)
Creatinine, Ser: 1.14 mg/dL (ref 0.76–1.27)
GFR calc non Af Amer: 76 mL/min/{1.73_m2} (ref >59)
GFR, EST AFRICAN AMERICAN: 87 mL/min/{1.73_m2} (ref >59)
GLUCOSE: 108 mg/dL — AB (ref 65–99)
POTASSIUM: 4.3 mmol/L (ref 3.5–5.2)
Sodium: 142 mmol/L (ref 134–144)

## 2017-06-30 ENCOUNTER — Telehealth: Payer: Self-pay | Admitting: Physician Assistant

## 2017-06-30 NOTE — Telephone Encounter (Signed)
-----   Message from Laurann [place], [place] sent at 06/30/2017  7:35 AM EDT ----- Please let patient know labs are stable. Kidney function remains stable. Dayna Dunn PA-C

## 2017-06-30 NOTE — Telephone Encounter (Signed)
Patient returning call for lab results. 

## 2017-06-30 NOTE — Telephone Encounter (Signed)
-----   Message from Dayna N Dunn, PA-C sent at 06/30/2017  7:35 AM EDT ----- Please let patient know labs are stable. Kidney function remains stable. Dayna Dunn PA-C  

## 2017-06-30 NOTE — Telephone Encounter (Signed)
Returned pts call and his wife, DPR on file, has been made aware of the lab results and she verbalized understanding.

## 2017-06-30 NOTE — Telephone Encounter (Signed)
Follow up     Pt is returning call about labs. Please call.

## 2017-07-05 ENCOUNTER — Telehealth: Payer: Self-pay | Admitting: Cardiovascular Disease

## 2017-07-05 NOTE — Telephone Encounter (Signed)
Called pt and spoke with pt's significant other Carney Bern, to inform her that pt will have to refill his nicotine patches with his PCP and that we refill heart medications and that the doctor did not prescribe this medication for the pt. I advised her that if the pt has any other problems, questions or concerns to call the office. Cala Bradford verbalized understanding.

## 2017-07-05 NOTE — Telephone Encounter (Signed)
New Message      *STAT* If patient is at the pharmacy, call can be transferred to refill team.   1. Which medications need to be refilled? (please list name of each medication and dose if known)  Step 2 of nicotine patches   2. Which pharmacy/location (including street and city if local pharmacy) is medication to be sent to? [facility] [address]  3. Do they need a 30 day or 90 day supply? 30

## 2017-11-02 ENCOUNTER — Other Ambulatory Visit: Payer: Self-pay | Admitting: Student

## 2017-11-10 ENCOUNTER — Telehealth: Payer: Self-pay | Admitting: Cardiovascular Disease

## 2017-11-10 NOTE — Telephone Encounter (Signed)
I spoke with Caleb Grant and advised her to call pt's pharmacy to see if his Losartan is included in recall.

## 2017-11-10 NOTE — Telephone Encounter (Signed)
New message   Caleb Grant calling with concerns about recall on Losartan. They have not reached out to their pharmacy. Requesting to speak with nurse. Please call     Pt c/o medication issue:  1. Name of Medication: losartan (COZAAR) 25 MG tablet(Expired)  2. How are you currently taking this medication (dosage and times per day)? As prescribed  3. Are you having a reaction (difficulty breathing--STAT)? No  4. What is your medication issue? Caleb Grant concerned about recall, has not contact pharmacy.

## 2018-01-06 ENCOUNTER — Ambulatory Visit: Payer: Self-pay | Admitting: Cardiovascular Disease

## 2018-01-15 DIAGNOSIS — R0989 Other specified symptoms and signs involving the circulatory and respiratory systems: Secondary | ICD-10-CM

## 2018-03-03 ENCOUNTER — Encounter: Payer: Self-pay | Admitting: Cardiovascular Disease

## 2018-03-23 ENCOUNTER — Ambulatory Visit: Payer: Self-pay | Admitting: Cardiovascular Disease

## 2018-04-09 ENCOUNTER — Other Ambulatory Visit: Payer: Self-pay | Admitting: Cardiovascular Disease

## 2018-04-17 ENCOUNTER — Ambulatory Visit: Payer: Self-pay | Admitting: Cardiovascular Disease

## 2018-04-17 NOTE — Progress Notes (Deleted)
No chief complaint on file.   History of Present Illness: 47 yo male with history of CAD, tobacco abuse here today for follow up. He [facility] with a NSTEMI. Cardiac cath June 2017 with occluded second obtuse marginal branch treated with a DES x 1. Also with 20% mid LAD stenosis, 40% distal LAD stenosis, 20% proximal RCA stenosis. Echo June 2017 with LVEF=50-55%, posterolateral wall hypokinesis, mild MR. He has done well following his MI. He did have hemorrhoidal bleeding and Brilinta was stopped. He is a Naval architecttruck driver. He smokes 1 ppd. Failed suicide attempt in July 2018 (oxycodone with etoh).   He is here today for follow up. The patient denies any chest pain, dyspnea, palpitations, lower extremity edema, orthopnea, PND, dizziness, near syncope or syncope.   Primary Care Physician: Marva PandaMillsaps, Kimberly, NP  Past Medical History:  Diagnosis Date  . Back pain   . CAD (coronary artery disease)    a. 04/2016: NSTEMI and NSVT: Cath showing 100% stenosis 2nd Mrg (PCI w/ DES), 20% stenosis mid-LAD, 40% distal LAD, and 20% proxRCA   . Essential hypertension   . Suicide attempt (HCC) 05/2017  . Tobacco abuse   . Ventricular bigeminy    a. noted during 04/2016 admission. Started on BB.    Past Surgical History:  Procedure Laterality Date  . CARDIAC CATHETERIZATION N/A 04/28/2016   Procedure: Left Heart Cath and Coronary Angiography;  Surgeon: Kathleene Hazelhristopher D McAlhany, MD;  [facility];  Service: Cardiovascular;  Laterality: N/A;  . CARDIAC CATHETERIZATION N/A 04/28/2016   Procedure: Coronary Stent Intervention;  Surgeon: Kathleene Hazelhristopher D McAlhany, MD;  Location: [facility] INVASIVE CV LAB;  Service: Cardiovascular;  Laterality: N/A;    Current Outpatient Medications  Medication Sig Dispense Refill  . aspirin EC 81 MG EC tablet Take 1 tablet (81 mg total) by mouth daily.    Marland Kitchen. atorvastatin (LIPITOR) 40 MG tablet TAKE 1 TABLET BY MOUTH EVERY DAY 90 tablet 3  .  Cyanocobalamin (VITAMIN B-12 PO) Take 1 tablet by mouth daily.    . cyclobenzaprine (FLEXERIL) 10 MG tablet Take 10 mg by mouth every 6 (six) hours as needed for muscle spasms.    . folic acid (FOLVITE) 1 MG tablet Take 1 mg by mouth daily.    Marland Kitchen. losartan (COZAAR) 25 MG tablet Take 1 tablet (25 mg total) by mouth daily. 90 tablet 3  . metoprolol tartrate (LOPRESSOR) 25 MG tablet Take 0.5 tablets (12.5 mg total) by mouth 2 (two) times daily. 180 tablet 3  . metoprolol tartrate (LOPRESSOR) 25 MG tablet TAKE 0.5 TABLETS (12.5 MG TOTAL) BY MOUTH 2 (TWO) TIMES DAILY. 180 tablet 0  . naproxen (NAPROSYN) 500 MG tablet Take 500 mg by mouth every 6 (six) hours as needed for mild pain, moderate pain or headache.    . nicotine (NICODERM CQ - DOSED IN MG/24 HOURS) 21 mg/24hr patch Place 1 patch (21 mg total) onto the skin daily. 28 patch 0  . nitroGLYCERIN (NITROSTAT) 0.4 MG SL tablet PLACE 1 TABLET UNDER THE TONGUE EVERY 5 MINUTES X3 DOSES AS NEEDED FOR CHEST PAIN 25 tablet 5  . Oxycodone HCl 10 MG TABS Take 10 mg by mouth every 6 (six) hours as needed (pain).     No current facility-administered medications for this visit.     No Known Allergies  Social History   Socioeconomic History  . Marital status: Married    Spouse name: Not on file  .  Number of children: Not on file  . Years of education: Not on file  . Highest education level: Not on file  Occupational History  . Not on file  Social Needs  . Financial resource strain: Not on file  . Food insecurity:    Worry: Not on file    Inability: Not on file  . Transportation needs:    Medical: Not on file    Non-medical: Not on file  Tobacco Use  . Smoking status: Current Every Day Smoker    Packs/day: 2.00    Types: Cigarettes  . Smokeless tobacco: Never Used  Substance and Sexual Activity  . Alcohol use: No  . Drug use: No  . Sexual activity: Not on file  Lifestyle  . Physical activity:    Days per week: Not on file    Minutes per  session: Not on file  . Stress: Not on file  Relationships  . Social connections:    Talks on phone: Not on file    Gets together: Not on file    Attends religious service: Not on file    Active member of club or organization: Not on file    Attends meetings of clubs or organizations: Not on file    Relationship status: Not on file  . Intimate partner violence:    Fear of current or ex partner: Not on file    Emotionally abused: Not on file    Physically abused: Not on file    Forced sexual activity: Not on file  Other Topics Concern  . Not on file  Social History Narrative  . Not on file    Family History  Problem Relation Age of Onset  . Coronary artery disease Father   . Coronary artery disease Paternal Grandfather   . Heart disease Sister   . Heart failure Sister     Review of Systems:  As stated in the HPI and otherwise negative.   There were no vitals taken for this visit.  Physical Examination:  General: Well developed, well nourished, NAD  HEENT: OP clear, mucus membranes moist  SKIN: warm, dry. No rashes. Neuro: No focal deficits  Musculoskeletal: Muscle strength 5/5 all ext  Psychiatric: Mood and affect normal  Neck: No JVD, no carotid bruits, no thyromegaly, no lymphadenopathy.  Lungs:Clear bilaterally, no wheezes, rhonci, crackles Cardiovascular: Regular rate and rhythm. No murmurs, gallops or rubs. Abdomen:Soft. Bowel sounds present. Non-tender.  Extremities: No lower extremity edema. Pulses are 2 + in the bilateral DP/PT.  Echo June 2017: Left ventricle: Posterior lateral wall hypokinesis The cavity   size was normal. Wall thickness was increased in a pattern of   mild LVH. Systolic function was normal. The estimated ejection   fraction was in the range of 50% to 55%. Left ventricular   diastolic function parameters were normal. - Mitral valve: There was mild regurgitation. - Atrial septum: No defect or patent foramen ovale was identified.  EKG:   EKG is ordered today. The ekg ordered today demonstrates sinus, PVCs in pattern of bigeminy. Non-specific T wave abn.   Recent Labs: 05/29/2017: TSH 2.120 05/30/2017: ALT 40 05/31/2017: Hemoglobin 14.7; Platelets 140 06/28/2017: BUN 12; Creatinine, Ser 1.14; Potassium 4.3; Sodium 142   Lipid Panel    Component Value Date/Time   CHOL 81 (L) 03/04/2017 0839   TRIG 132 03/04/2017 0839   HDL 23 (L) 03/04/2017 0839   CHOLHDL 3.5 03/04/2017 0839   CHOLHDL 5.6 04/29/2016 0232   VLDL 26 04/29/2016  0232   LDLCALC 32 03/04/2017 0839     Wt Readings from Last 3 Encounters:  06/13/17 169 lb 3.2 oz (76.7 kg)  05/30/17 174 lb 2.6 oz (79 kg)  03/04/17 166 lb (75.3 kg)     Other studies Reviewed: Additional studies/ records that were reviewed today include: . Review of the above records demonstrates:   Assessment and Plan:   1. CAD without angina: No chest pain. Will continue ASA, statin and beta blocker.   2. HTN: BP is controlled. No changes.   3. Hyperlipidemia: LDL at goal in 2018. Will continue statin. *** Repeat lipids and LFTs now.   4. Tobacco abuse: Tobacco cessation is encouraged.   5. Erectile dysfunction: Viagra prn.   Current medicines are reviewed at length with the patient today.  The patient does not have concerns regarding medicines.  The following changes have been made:  no change  Labs/ tests ordered today include:   No orders of the defined types were placed in this encounter.  Disposition:   FU with me in 12 months  Signed, Verne Carrow, MD 04/17/2018 11:08 AM    [facility] [address] [place], [place], [place]  [address] Phone: [phone]; Fax: [phone]

## 2018-04-19 ENCOUNTER — Telehealth: Payer: Self-pay | Admitting: Cardiovascular Disease

## 2018-04-19 NOTE — Telephone Encounter (Signed)
New Message:       Pt missed appt on Monday(6/10) and pt is calling back and would like an immediate appt. Pt states they can not wait until 7/30 to see a PA.

## 2018-04-19 NOTE — Telephone Encounter (Signed)
Spoke to patient's wife and set up an appointment for 6/26 @ 2pm with Leda GauzeM. Lenze.  They were pleased with that time frame.

## 2018-04-24 ENCOUNTER — Other Ambulatory Visit: Payer: Self-pay

## 2018-04-24 MED ORDER — ATORVASTATIN CALCIUM 40 MG PO TABS: ORAL_TABLET | ORAL | 0 refills | Status: DC

## 2018-05-03 ENCOUNTER — Encounter (INDEPENDENT_AMBULATORY_CARE_PROVIDER_SITE_OTHER): Payer: Self-pay

## 2018-05-03 ENCOUNTER — Ambulatory Visit (INDEPENDENT_AMBULATORY_CARE_PROVIDER_SITE_OTHER): Payer: Self-pay | Admitting: Physician Assistant

## 2018-05-03 ENCOUNTER — Encounter: Payer: Self-pay | Admitting: Physician Assistant

## 2018-05-03 VITALS — BP 110/72 | HR 83 | Ht 67.0 in | Wt 174.4 lb

## 2018-05-03 DIAGNOSIS — I10 Essential (primary) hypertension: Secondary | ICD-10-CM

## 2018-05-03 DIAGNOSIS — E7849 Other hyperlipidemia: Secondary | ICD-10-CM

## 2018-05-03 DIAGNOSIS — R079 Chest pain, unspecified: Secondary | ICD-10-CM

## 2018-05-03 DIAGNOSIS — Z720 Tobacco use: Secondary | ICD-10-CM

## 2018-05-03 DIAGNOSIS — I2510 Atherosclerotic heart disease of native coronary artery without angina pectoris: Secondary | ICD-10-CM

## 2018-05-03 MED ORDER — ISOSORBIDE MONONITRATE ER 30 MG PO TB24: 30.0000 mg | ORAL_TABLET | Freq: Every day | ORAL | 3 refills | Status: DC

## 2018-05-03 MED ORDER — METOPROLOL SUCCINATE ER 25 MG PO TB24: 25.0000 mg | ORAL_TABLET | Freq: Every day | ORAL | 3 refills | Status: DC

## 2018-05-03 NOTE — Patient Instructions (Signed)
Medication Instructions: Your physician has recommended you make the following change in your medication:  START: Metoprolol Succinate 25 mg daily START: Isosorbide 30 mg daily   Labwork: FUTURE: FASTING LIPIDS, CMET & CBC ( TO BE DONE AT [place])  Procedures/Testing: Your physician has requested that you have a lexiscan myoview at [facility] . For further information please visit [email or web address]. Please follow instruction sheet, as given.    Follow-Up: Your physician recommends that you schedule a follow-up appointment with Dr.Mcalhany or Jacolyn ReedyMichele Lenze PA-C after you have your test done.   Any Additional Special Instructions Will Be Listed Below (If Applicable).     If you need a refill on your cardiac medications before your next appointment, please call your pharmacy.

## 2018-05-03 NOTE — Progress Notes (Signed)
Cardiology Office Note    Date:  05/03/2018   ID:  Caleb Grant, DOB 04/02/1971, MRN [id number]  PCP:  Marva Panda, NP  Cardiologist: Verne Carrow, MD  Chief Complaint  Patient presents with  . Follow-up    History of Present Illness:  Caleb Grant is a 47 y.o. male with history of CAD status post NSTEMI 04/2016 treated with DES to OM 2 nonobstructive disease in the LAD and RCA.  Also has hypertension, tobacco abuse, ventricular ectopy during hospitalization for MI, Brilinta stopped due to hemorrhoidal bleeding and has been on aspirin.  In July 2018 he was admitted with suicide attempt by overdosing on oxycodone while intoxicated.  2D echo 04/2016 EF 50 to 55% with posterior lateral wall hypokinesis and mild MR.  Seen in our office 06/13/2017 by Lucile Crater, PA-C at which time he was having blood pressures spikes precipitated by back pain.  Losartan was added.  Did not want to increase beta-blocker because of depression.  Smoking cessation recommended.  Patient comes in for yearly f/u. Complains of chest tightness when out walking, working on car, or under stress eases with rest within 10 min. Has been going on for about a year but too stubborn to tell anyone. Occurs about 2-3 times/week. Smoking 1 ppd.  Hurt his back and is having a lot of pain.    Past Medical History:  Diagnosis Date  . Back pain   . CAD (coronary artery disease)    a. 04/2016: NSTEMI and NSVT: Cath showing 100% stenosis 2nd Mrg (PCI w/ DES), 20% stenosis mid-LAD, 40% distal LAD, and 20% proxRCA   . Essential hypertension   . Suicide attempt (HCC) 05/2017  . Tobacco abuse   . Ventricular bigeminy    a. noted during 04/2016 admission. Started on BB.    Past Surgical History:  Procedure Laterality Date  . CARDIAC CATHETERIZATION N/A 04/28/2016   Procedure: Left Heart Cath and Coronary Angiography;  Surgeon: Kathleene Hazel, MD;  Location[facility] INVASIVE CV LAB;  Service: Cardiovascular;   Laterality: N/A;  . CARDIAC CATHETERIZATION N/A 04/28/2016   Procedure: Coronary Stent Intervention;  Surgeon: Kathleene Hazel, MD;  Location: [facility] INVASIVE CV LAB;  Service: Cardiovascular;  Laterality: N/A;    Current Medications: Current Meds  Medication Sig  . aspirin EC 81 MG EC tablet Take 1 tablet (81 mg total) by mouth daily.  Marland Kitchen atorvastatin (LIPITOR) 40 MG tablet TAKE 1 TABLET BY MOUTH EVERY DAY  . Cyanocobalamin (VITAMIN B-12 PO) Take 100 mg by mouth daily.   . folic acid (FOLVITE) 1 MG tablet Take 1 mg by mouth daily.  Marland Kitchen gabapentin (NEURONTIN) 600 MG tablet Take 600 mg by mouth 3 (three) times daily.  . metoprolol tartrate (LOPRESSOR) 25 MG tablet TAKE 0.5 TABLETS (12.5 MG TOTAL) BY MOUTH 2 (TWO) TIMES DAILY.  . nitroGLYCERIN (NITROSTAT) 0.4 MG SL tablet PLACE 1 TABLET UNDER THE TONGUE EVERY 5 MINUTES X3 DOSES AS NEEDED FOR CHEST PAIN  . Oxycodone HCl 10 MG TABS Take 10 mg by mouth every 6 (six) hours as needed (pain).     Allergies:   Patient has no known allergies.   Social History   Socioeconomic History  . Marital status: Married    Spouse name: Not on file  . Number of children: Not on file  . Years of education: Not on file  . Highest education level: Not on file  Occupational History  . Not on file  Social Needs  .  Financial resource strain: Not on file  . Food insecurity:    Worry: Not on file    Inability: Not on file  . Transportation needs:    Medical: Not on file    Non-medical: Not on file  Tobacco Use  . Smoking status: Current Every Day Smoker    Packs/day: 2.00    Types: Cigarettes  . Smokeless tobacco: Never Used  Substance and Sexual Activity  . Alcohol use: No  . Drug use: No  . Sexual activity: Not on file  Lifestyle  . Physical activity:    Days per week: Not on file    Minutes per session: Not on file  . Stress: Not on file  Relationships  . Social connections:    Talks on phone: Not on file    Gets together: Not on file     Attends religious service: Not on file    Active member of club or organization: Not on file    Attends meetings of clubs or organizations: Not on file    Relationship status: Not on file  Other Topics Concern  . Not on file  Social History Narrative  . Not on file     Family History:  The patient's family history includes Coronary artery disease in his father and paternal grandfather; Heart disease in his sister; Heart failure in his sister.   ROS:   Please see the history of present illness.    Review of Systems  Constitution: Positive for malaise/fatigue.  HENT: Negative.   Cardiovascular: Positive for chest pain and dyspnea on exertion.  Respiratory: Positive for cough, snoring and wheezing.   Endocrine: Negative.   Hematologic/Lymphatic: Bruises/bleeds easily.  Musculoskeletal: Positive for back pain and myalgias.  Gastrointestinal: Negative.   Genitourinary: Negative.   Neurological: Positive for headaches, light-headedness and loss of balance.  Psychiatric/Behavioral: Positive for depression.   All other systems reviewed and are negative.   PHYSICAL EXAM:   VS:  BP 110/72   Pulse 83   Ht 5\' 7"  (1.702 m)   Wt 174 lb 6.4 oz (79.1 kg)   SpO2 98%   BMI 27.31 kg/m   Physical Exam  GEN: Well nourished, well developed, in no acute distress  Neck: no JVD, carotid bruits, or masses Cardiac:RRR; no murmurs, rubs, or gallops  Respiratory:  clear to auscultation bilaterally, normal work of breathing GI: soft, nontender, nondistended, + BS Ext: without cyanosis, clubbing, or edema, Good distal pulses bilaterally Neuro:  Alert and Oriented x 3 Psych: euthymic mood, full affect  Wt Readings from Last 3 Encounters:  05/03/18 174 lb 6.4 oz (79.1 kg)  06/13/17 169 lb 3.2 oz (76.7 kg)  05/30/17 174 lb 2.6 oz (79 kg)      Studies/Labs Reviewed:   EKG:  EKG is  ordered today.  The ekg ordered today demonstrates normal sinus rhythm with nonspecific T wave changes lead III in  the 5 and V6 aVF similar to last year  Recent Labs: 05/29/2017: TSH 2.120 05/30/2017: ALT 40 05/31/2017: Hemoglobin 14.7; Platelets 140 06/28/2017: BUN 12; Creatinine, Ser 1.14; Potassium 4.3; Sodium 142   Lipid Panel    Component Value Date/Time   CHOL 81 (L) 03/04/2017 0839   TRIG 132 03/04/2017 0839   HDL 23 (L) 03/04/2017 0839   CHOLHDL 3.5 03/04/2017 0839   CHOLHDL 5.6 04/29/2016 0232   VLDL 26 04/29/2016 0232   LDLCALC 32 03/04/2017 0839    Additional studies/ records that were reviewed today include:  Cardiac catheterization 04/28/2016 Procedures   Coronary Stent Intervention  Left Heart Cath and Coronary Angiography  Conclusion    Prox RCA lesion, 20% stenosed.  Prox LAD to Mid LAD lesion, 20% stenosed.  Mid LAD to Dist LAD lesion, 40% stenosed.  2nd Mrg lesion, 100% stenosed. Post intervention, there is a 0% residual stenosis. The lesion was not previously treated.  The left ventricular systolic function is normal.   1. Acute MI/NSTEMI secondary to occluded large first obtuse marginal branch. (No ST elevation on EKG).  2. Successful PTCA/DES x 1 obtuse marginal branch.  3. Mild non-obstructive disease in the LAD and RCA 4. Overall preserved LV systolic function   Recommendations: Will continue ASA and Brilinta for one year. Will start statin and beta blocker. Continue amiodarone drip tonight.     2D echo 04/29/2016 Study Conclusions   - Left ventricle: Posterior lateral wall hypokinesis The cavity   size was normal. Wall thickness was increased in a pattern of   mild LVH. Systolic function was normal. The estimated ejection   fraction was in the range of 50% to 55%. Left ventricular   diastolic function parameters were normal. - Mitral valve: There was mild regurgitation. - Atrial septum: No defect or patent foramen ovale was identified.   Transthoracic echocardiography.  M-mode, complete 2D, spectral Doppler, and color Doppler.  Birthdate:  Patient  birthdate: 1971-06-19.  Age:  Patient is 47 yr old.  Sex:  Gender: male. BMI: 25.6 kg/m^2.  Blood pressure:     99/74  Patient status: Inpatient.  Study date:  Study date: 04/29/2016. Study time: 11:35 AM.  Location:  Bedside.   ASSESSMENT:    1. Coronary artery disease involving native coronary artery of native heart without angina pectoris   2. Essential hypertension   3. Tobacco abuse   4. Other hyperlipidemia   5. Chest pain, unspecified type      PLAN:  In order of problems listed above:  Chest pain worrisome for ischemia.  Having tightness with little activity that seems to have worsened over the past year.  Will add Imdur 30 mg once daily in order [employer] Myov[employer].  Patient is not wanting to proceed with cardiac catheterization initially.  CAD status post NSTEMI treated with DES to the second OM1, mild nonobstructive disease in the LAD and RCA, normal LV function.  Off Brilinta because of hemorrhoidal bleeding.  Continue aspirin.  Follow-up with Dr. Clifton JamesMcAlhany or myself after stress test.  Essential hypertension blood pressure controlled.  Would like to switch Toprol to long-acting because it is too hard to cut his pill in half.  We will make the change.  Tobacco abuse smoking cessation recommended.  Did not have much luck using nicotine patches.  Hyperlipidemia has not had his lipids checked in a long time.  Will check when he comes back for his stress test.  Also check other maintenance labs.  Continue Lipitor 40 mg once daily.    Medication Adjustments/Labs and Tests Ordered: Current medicines are reviewed at length with the patient today.  Concerns regarding medicines are outlined above.  Medication changes, Labs and Tests ordered today are listed in the Patient Instructions below. There are no Patient Instructions on file for this visit.   Elson ClanSigned, Corri Delapaz, PA-C  05/03/2018 2:53 PM    [facility] [address], [place], [place]   [address] Phone: [phone]; Fax: [phone]

## 2018-05-04 ENCOUNTER — Other Ambulatory Visit: Payer: Self-pay

## 2018-05-04 DIAGNOSIS — R079 Chest pain, unspecified: Secondary | ICD-10-CM

## 2018-05-05 ENCOUNTER — Inpatient Hospital Stay (HOSPITAL_COMMUNITY)
Admission: RE | Admit: 2018-05-05 | Discharge: 2018-05-05 | Disposition: A | Discharge status: Home / Self Care | Payer: Self-pay | Source: Ambulatory Visit

## 2018-05-05 ENCOUNTER — Ambulatory Visit (HOSPITAL_COMMUNITY): Payer: Self-pay

## 2018-05-05 DIAGNOSIS — R079 Chest pain, unspecified: Secondary | ICD-10-CM

## 2018-05-12 ENCOUNTER — Ambulatory Visit (HOSPITAL_COMMUNITY): Payer: Self-pay

## 2018-05-12 ENCOUNTER — Encounter (HOSPITAL_COMMUNITY): Payer: Self-pay

## 2018-05-15 ENCOUNTER — Encounter: Payer: Self-pay | Admitting: Physician Assistant

## 2018-05-15 NOTE — Progress Notes (Deleted)
Cardiology Office Note    Date:  05/15/2018  ID:  Caleb Grant, DOB [birth year], MRN [id number] PCP:  Everardo Beals, NP  Cardiologist:  Lauree Chandler, MD   Chief Complaint: f/u chest pain  History of Present Illness:  Caleb Grant is a 47 y.o. male with history of CAD (NSTEMI 04/2016 s/p DES to OM2, HTN, residual nonobstructive LAD/RCA disease), tobacco abuse, ventricular bigeminy during 04/2016 admission, PVCs noted on OP EKGs as well, erectile dysfunction, hemorrhoids, depression with prior SI who presents for f/u of chest pain. Echo June 2017 with LVEF=50-55%, posterolateral wall hypokinesis, mild MR. Brilinta was stopped previously due to hemorrhoidal bleeding and he's been managed with aspirin. In July 2018 he was admitted with suicide attempt by overdosing on oxycodone while intoxicated. This was in the setting of stress of life and finances and his son overdrafting his account. I met him in 06/2017 for HTN in setting of recent bulging disc isuses, prompting initiation of losartan '25mg'$  daily. More recently he was seen 05/03/18 with chest tightness with exertion or stress, improved with resting, 2-3x a week. She added Imdur and recommended nuclear stress test which has not been done. Last labs 06/2017 showed Cr 1.14, K 4.3, 05/2017 Hgb 14.7, Plt 140, TSH wnl.    bb not titratde due to depression 24 hour holter no labs   CAD/chest pain PVCs Essential HTN Tobacco abuse      Past Medical History:  Diagnosis Date  . Back pain   . CAD (coronary artery disease)    a. 04/2016: NSTEMI and NSVT: Cath showing 100% stenosis 2nd Mrg (PCI w/ DES), 20% stenosis mid-LAD, 40% distal LAD, and 20% proxRCA   . Essential hypertension   . PVC's (premature ventricular contractions)   . Suicide attempt ([place]) 05/2017  . Tobacco abuse   . Ventricular bigeminy    a. noted during 04/2016 admission. Started on BB.    Past Surgical History:  Procedure Laterality Date  . CARDIAC  CATHETERIZATION N/A 04/28/2016   Procedure: Left Heart Cath and Coronary Angiography;  Surgeon: Burnell Blanks, MD;  Location: [facility];  Service: Cardiovascular;  Laterality: N/A;  . CARDIAC CATHETERIZATION N/A 04/28/2016   Procedure: Coronary Stent Intervention;  Surgeon: Burnell Blanks, MD;  Location: [facility];  Service: Cardiovascular;  Laterality: N/A;    Current Medications: No outpatient medications have been marked as taking for the 05/16/18 encounter (Appointment) with Charlie Pitter, PA-C.   ***   Allergies:   Patient has no known allergies.   Social History   Socioeconomic History  . Marital status: Married    Spouse name: Not on file  . Number of children: Not on file  . Years of education: Not on file  . Highest education level: Not on file  Occupational History  . Not on file  Social Needs  . Financial resource strain: Not on file  . Food insecurity:    Worry: Not on file    Inability: Not on file  . Transportation needs:    Medical: Not on file    Non-medical: Not on file  Tobacco Use  . Smoking status: Current Every Day Smoker    Packs/day: 2.00    Types: Cigarettes  . Smokeless tobacco: Never Used  Substance and Sexual Activity  . Alcohol use: No  . Drug use: No  . Sexual activity: Not on file  Lifestyle  . Physical activity:    Days per week: Not  on file    Minutes per session: Not on file  . Stress: Not on file  Relationships  . Social connections:    Talks on phone: Not on file    Gets together: Not on file    Attends religious service: Not on file    Active member of club or organization: Not on file    Attends meetings of clubs or organizations: Not on file    Relationship status: Not on file  Other Topics Concern  . Not on file  Social History Narrative  . Not on file     Family History:  The patient's ***family history includes Coronary artery disease in his father and paternal grandfather; Heart  disease in his sister; Heart failure in his sister.  ROS:   Please see the history of present illness. Otherwise, review of systems is positive for ***.  All other systems are reviewed and otherwise negative.    PHYSICAL EXAM:   VS:  There were no vitals taken for this visit.  BMI: There is no height or weight on file to calculate BMI. GEN: Well nourished, well developed, in no acute distress HEENT: normocephalic, atraumatic Neck: no JVD, carotid bruits, or masses Cardiac: ***RRR; no murmurs, rubs, or gallops, no edema  Respiratory:  clear to auscultation bilaterally, normal work of breathing GI: soft, nontender, nondistended, + BS MS: no deformity or atrophy Skin: warm and dry, no rash Neuro:  Alert and Oriented x 3, Strength and sensation are intact, follows commands Psych: euthymic mood, full affect  Wt Readings from Last 3 Encounters:  05/03/18 174 lb 6.4 oz (79.1 kg)  06/13/17 169 lb 3.2 oz (76.7 kg)  05/30/17 174 lb 2.6 oz (79 kg)      Studies/Labs Reviewed:   EKG:  EKG was ordered today and personally reviewed by me and demonstrates *** EKG was not ordered today.***  Recent Labs: 05/29/2017: TSH 2.120 05/30/2017: ALT 40 05/31/2017: Hemoglobin 14.7; Platelets 140 06/28/2017: BUN 12; Creatinine, Ser 1.14; Potassium 4.3; Sodium 142   Lipid Panel    Component Value Date/Time   CHOL 81 (L) 03/04/2017 0839   TRIG 132 03/04/2017 0839   HDL 23 (L) 03/04/2017 0839   CHOLHDL 3.5 03/04/2017 0839   CHOLHDL 5.6 04/29/2016 0232   VLDL 26 04/29/2016 0232   LDLCALC 32 03/04/2017 0839    Additional studies/ records that were reviewed today include: Summarized above.***    ASSESSMENT & PLAN:   1. ***  Disposition: F/u with ***   Medication Adjustments/Labs and Tests Ordered: Current medicines are reviewed at length with the patient today.  Concerns regarding medicines are outlined above. Medication changes, Labs and Tests ordered today are summarized above and listed  in the Patient Instructions accessible in Encounters.   Signed, Charlie Pitter, PA-C  05/15/2018 2:01 PM    [facility] [place], [place], [place]  [address] Phone: [phone]; Fax: [phone]

## 2018-05-16 ENCOUNTER — Ambulatory Visit: Payer: Self-pay | Admitting: Physician Assistant

## 2018-06-14 ENCOUNTER — Encounter: Payer: Self-pay | Admitting: Cardiovascular Disease

## 2018-08-09 ENCOUNTER — Telehealth (HOSPITAL_COMMUNITY): Payer: Self-pay

## 2018-08-09 ENCOUNTER — Other Ambulatory Visit: Payer: Self-pay

## 2018-08-09 DIAGNOSIS — R079 Chest pain, unspecified: Secondary | ICD-10-CM

## 2018-08-09 NOTE — Telephone Encounter (Signed)
Encounter complete. 

## 2018-08-10 ENCOUNTER — Ambulatory Visit (HOSPITAL_COMMUNITY)
Admission: RE | Admit: 2018-08-10 | Discharge: 2018-08-10 | Disposition: A | Discharge status: Home / Self Care | Payer: Self-pay | Source: Ambulatory Visit | Attending: Cardiology | Admitting: Cardiology

## 2018-08-10 DIAGNOSIS — R079 Chest pain, unspecified: Secondary | ICD-10-CM | POA: Insufficient documentation

## 2018-08-10 LAB — MYOCARDIAL PERFUSION IMAGING
CHL CUP NUCLEAR SDS: 3
CHL CUP NUCLEAR SRS: 6
CHL CUP NUCLEAR SSS: 9
NUC STRESS TID: 1
Peak HR: 113 {beats}/min
Rest HR: 60 {beats}/min

## 2018-08-10 MED ORDER — REGADENOSON 0.4 MG/5ML IV SOLN
0.4000 mg | Freq: Once | INTRAVENOUS | Status: AC
  Administered 2018-08-10: 0.4 mg via INTRAVENOUS

## 2018-08-10 MED ORDER — TECHNETIUM TC 99M TETROFOSMIN IV KIT
10.9000 | PACK | Freq: Once | INTRAVENOUS | Status: AC | PRN
  Administered 2018-08-10: 10.9 via INTRAVENOUS
  Filled 2018-08-10: qty 11

## 2018-08-10 MED ORDER — TECHNETIUM TC 99M TETROFOSMIN IV KIT
31.2000 | PACK | Freq: Once | INTRAVENOUS | Status: AC | PRN
  Administered 2018-08-10: 31.2 via INTRAVENOUS
  Filled 2018-08-10: qty 32

## 2018-08-11 ENCOUNTER — Telehealth: Payer: Self-pay

## 2018-08-11 MED ORDER — LOSARTAN POTASSIUM 25 MG PO TABS: 25.0000 mg | ORAL_TABLET | Freq: Every day | ORAL | 3 refills | Status: DC

## 2018-08-11 MED ORDER — ATORVASTATIN CALCIUM 40 MG PO TABS: ORAL_TABLET | ORAL | 3 refills | Status: DC

## 2018-08-11 MED ORDER — ISOSORBIDE MONONITRATE ER 30 MG PO TB24: 30.0000 mg | ORAL_TABLET | Freq: Every day | ORAL | 3 refills | Status: DC

## 2018-08-11 MED ORDER — METOPROLOL SUCCINATE ER 25 MG PO TB24: 25.0000 mg | ORAL_TABLET | Freq: Every day | ORAL | 3 refills | Status: DC

## 2018-08-11 NOTE — Telephone Encounter (Signed)
-----   Message from Dyann Kief, PA-C sent at 08/11/2018  8:12 AM EDT ----- Stress test shows evidence of prior heart attack but no  Ischemia. Is he doing better on Imdur? If he's still having frequent chest pain we need to get him in with Dr. Clifton Jailyn. Encourage smoking cessation. Thanks

## 2018-08-11 NOTE — Telephone Encounter (Signed)
Called and made patient aware that his stress test shows evidence of a prior heart attack but no ischemia. Patient states that he has not been taking his imdur or Toprol due to cost. Patient states that he is still having intermittent episodes of chest pain but does not feel like it is that bad. He states that he has been SOB on exertion and that has bothered him the most. Patient states that was around 150/94 the last time that his daughter checked it. Patient denies any Sx at this present time. Appointment made for patient to see Dr. Clifton Alford on Monday 10/7 at 10:40 AM. Made patient aware that his meds are on the $4 list at [facility]. Will send in 30-day supply of cardiac meds to [facility] in [place] per patient's request. Patient verbalized understanding and states that he will pick up these meds and keep his appointment on Monday. Patient will let us know if he has any change in his Sx. Encouraged patient to quit smoking.

## 2018-08-13 NOTE — Progress Notes (Signed)
Chief Complaint  Patient presents with  . Follow-up    CAD    History of Present Illness: 47 yo male with history of CAD and tobacco abuse here today for follow up. He was admitted to [facility] June 2017 with a NSTEMI. Cardiac cath June 2017 with occluded second obtuse marginal branch treated with a DES x 1. Also with 20% mid LAD stenosis, 40% distal LAD stenosis, 20% proximal RCA stenosis. Echo June 2017 with LVEF=50-55%, posterolateral wall hypokinesis, mild MR. He did have hemorrhoidal bleeding and Brilinta was stopped. He was maintained on ASA. He was admitted with a suicide attempt in July 2018 by overdosing on oxycodone while intoxicated. He continue to smoke. He was seen in our office by Ermalinda Barrios, PA in June 2019 and c/o chest pain. Nuclear stress test August 10, 2018 with scar but no ischemia. Pt contacted with results and stated he was not taking his Imdur or Toprol due to cost. He continues to smoke 1 ppd.   he is here today for follow up. The patient denies any chest pain, dyspnea, palpitations, lower extremity edema, orthopnea, PND, dizziness, near syncope or syncope.    Primary Care Physician: Everardo Beals, NP  Past Medical History:  Diagnosis Date  . Back pain   . CAD (coronary artery disease)    a. 04/2016: NSTEMI and NSVT: Cath showing 100% stenosis 2nd Mrg (PCI w/ DES), 20% stenosis mid-LAD, 40% distal LAD, and 20% proxRCA   . Essential hypertension   . PVC's (premature ventricular contractions)   . Suicide attempt ([place]) 05/2017  . Tobacco abuse   . Ventricular bigeminy    a. noted during 04/2016 admission. Started on BB.    Past Surgical History:  Procedure Laterality Date  . CARDIAC CATHETERIZATION N/A 04/28/2016   Procedure: Left Heart Cath and Coronary Angiography;  Surgeon: Burnell Blanks, MD;  Location: [facility];  Service: Cardiovascular;  Laterality: N/A;  . CARDIAC CATHETERIZATION N/A 04/28/2016   Procedure: Coronary Stent Intervention;   Surgeon: Burnell Blanks, MD;  Location: [facility];  Service: Cardiovascular;  Laterality: N/A;    Current Outpatient Medications  Medication Sig Dispense Refill  . aspirin EC 81 MG EC tablet Take 1 tablet (81 mg total) by mouth daily.    Marland Kitchen atorvastatin (LIPITOR) 40 MG tablet TAKE 1 TABLET BY MOUTH EVERY DAY 30 tablet 3  . carisoprodol (SOMA) 350 MG tablet Take 350 mg by mouth 2 (two) times daily. Take 350 mg by mouth twice daily. ('700mg'$ ) total daily.    . Cyanocobalamin (VITAMIN B-12 PO) Take 100 mg by mouth daily.     . folic acid (FOLVITE) 1 MG tablet Take 1 mg by mouth daily.    Marland Kitchen gabapentin (NEURONTIN) 600 MG tablet Take 600 mg by mouth 3 (three) times daily.    . isosorbide mononitrate (IMDUR) 30 MG 24 hr tablet Take 1 tablet (30 mg total) by mouth daily. 30 tablet 3  . losartan (COZAAR) 25 MG tablet Take 1 tablet (25 mg total) by mouth daily. 30 tablet 3  . metoprolol succinate (TOPROL XL) 25 MG 24 hr tablet Take 1 tablet (25 mg total) by mouth daily. 30 tablet 3  . nitroGLYCERIN (NITROSTAT) 0.4 MG SL tablet PLACE 1 TABLET UNDER THE TONGUE EVERY 5 MINUTES X3 DOSES AS NEEDED FOR CHEST PAIN 25 tablet 5  . Oxycodone HCl 10 MG TABS Take 10 mg by mouth every 6 (six) hours as needed (pain).  No current facility-administered medications for this visit.     No Known Allergies  Social History   Socioeconomic History  . Marital status: Married    Spouse name: Not on file  . Number of children: Not on file  . Years of education: Not on file  . Highest education level: Not on file  Occupational History  . Not on file  Social Needs  . Financial resource strain: Not on file  . Food insecurity:    Worry: Not on file    Inability: Not on file  . Transportation needs:    Medical: Not on file    Non-medical: Not on file  Tobacco Use  . Smoking status: Current Every Day Smoker    Packs/day: 2.00    Types: Cigarettes  . Smokeless tobacco: Never Used  Substance and  Sexual Activity  . Alcohol use: No  . Drug use: No  . Sexual activity: Not on file  Lifestyle  . Physical activity:    Days per week: Not on file    Minutes per session: Not on file  . Stress: Not on file  Relationships  . Social connections:    Talks on phone: Not on file    Gets together: Not on file    Attends religious service: Not on file    Active member of club or organization: Not on file    Attends meetings of clubs or organizations: Not on file    Relationship status: Not on file  . Intimate partner violence:    Fear of current or ex partner: Not on file    Emotionally abused: Not on file    Physically abused: Not on file    Forced sexual activity: Not on file  Other Topics Concern  . Not on file  Social History Narrative  . Not on file    Family History  Problem Relation Age of Onset  . Coronary artery disease Father   . Coronary artery disease Paternal Grandfather   . Heart disease Sister   . Heart failure Sister     Review of Systems:  As stated in the HPI and otherwise negative.   BP 120/70   Pulse 75   Ht '5\' 7"'$  (1.702 m)   Wt 173 lb 12.8 oz (78.8 kg)   SpO2 95%   BMI 27.22 kg/m   Physical Examination:  General: Well developed, well nourished, NAD  HEENT: OP clear, mucus membranes moist  SKIN: warm, dry. No rashes. Neuro: No focal deficits  Musculoskeletal: Muscle strength 5/5 all ext  Psychiatric: Mood and affect normal  Neck: No JVD, no carotid bruits, no thyromegaly, no lymphadenopathy.  Lungs:Clear bilaterally, no wheezes, rhonci, crackles Cardiovascular: Regular rate and rhythm. No murmurs, gallops or rubs. Abdomen:Soft. Bowel sounds present. Non-tender.  Extremities: No lower extremity edema. Pulses are 2 + in the bilateral DP/PT.  Echo June 2017: Left ventricle: Posterior lateral wall hypokinesis The cavity   size was normal. Wall thickness was increased in a pattern of   mild LVH. Systolic function was normal. The estimated  ejection   fraction was in the range of 50% to 55%. Left ventricular   diastolic function parameters were normal. - Mitral valve: There was mild regurgitation. - Atrial septum: No defect or patent foramen ovale was identified.  EKG:  EKG is not ordered today. The ekg ordered today demonstrates   Recent Labs: No results found for requested labs within last 8760 hours.   Lipid Panel  Component Value Date/Time   CHOL 81 (L) 03/04/2017 0839   TRIG 132 03/04/2017 0839   HDL 23 (L) 03/04/2017 0839   CHOLHDL 3.5 03/04/2017 0839   CHOLHDL 5.6 04/29/2016 0232   VLDL 26 04/29/2016 0232   LDLCALC 32 03/04/2017 0839     Wt Readings from Last 3 Encounters:  08/14/18 173 lb 12.8 oz (78.8 kg)  08/10/18 174 lb (78.9 kg)  05/03/18 174 lb 6.4 oz (79.1 kg)     Other studies Reviewed: Additional studies/ records that were reviewed today include: . Review of the above records demonstrates:   Assessment and Plan:   1. CAD without angina: Chest pain resolved since he restarted his Imdur and Toprol. Nuclear stress test 08/10/18 without ischemia. Will continue Imdur, Toprol, ASA and statin.   2. HTN: BP is controlled. No changes. Check BMET today.   3. Hyperlipidemia: Lipids controlled last year. Will check lipids and LFTS today.    4. Tobacco abuse: I have asked him to stop smoking.     Current medicines are reviewed at length with the patient today.  The patient does not have concerns regarding medicines.  The following changes have been made:  no change  Labs/ tests ordered today include:   Orders Placed This Encounter  Procedures  . Comp Met (CMET)  . Lipid Profile   Disposition:   FU with me in 12 months  Signed, Lauree Chandler, MD 08/14/2018 11:33 AM    [facility] [place], [place], [place]  [address] Phone: [phone]; Fax: [phone]

## 2018-08-14 ENCOUNTER — Ambulatory Visit (INDEPENDENT_AMBULATORY_CARE_PROVIDER_SITE_OTHER): Payer: Self-pay | Admitting: Cardiovascular Disease

## 2018-08-14 ENCOUNTER — Encounter: Payer: Self-pay | Admitting: Cardiovascular Disease

## 2018-08-14 VITALS — BP 120/70 | HR 75 | Ht 67.0 in | Wt 173.8 lb

## 2018-08-14 DIAGNOSIS — Z720 Tobacco use: Secondary | ICD-10-CM

## 2018-08-14 DIAGNOSIS — I2510 Atherosclerotic heart disease of native coronary artery without angina pectoris: Secondary | ICD-10-CM

## 2018-08-14 DIAGNOSIS — I10 Essential (primary) hypertension: Secondary | ICD-10-CM

## 2018-08-14 DIAGNOSIS — E7849 Other hyperlipidemia: Secondary | ICD-10-CM

## 2018-08-14 LAB — LIPID PANEL
Chol/HDL Ratio: 3.1 ratio (ref 0.0–5.0)
Cholesterol, Total: 84 mg/dL — ABNORMAL LOW (ref 100–199)
HDL: 27 mg/dL — ABNORMAL LOW (ref >39)
LDL CALC: 36 mg/dL (ref 0–99)
Triglycerides: 107 mg/dL (ref 0–149)
VLDL Cholesterol Cal: 21 mg/dL (ref 5–40)

## 2018-08-14 LAB — COMPREHENSIVE METABOLIC PANEL
A/G RATIO: 1.8 (ref 1.2–2.2)
ALBUMIN: 4 g/dL (ref 3.5–5.5)
ALT: 28 IU/L (ref 0–44)
AST: 18 IU/L (ref 0–40)
Alkaline Phosphatase: 91 IU/L (ref 39–117)
BUN / CREAT RATIO: 7 — AB (ref 9–20)
BUN: 9 mg/dL (ref 6–24)
Bilirubin Total: 0.5 mg/dL (ref 0.0–1.2)
CO2: 23 mmol/L (ref 20–29)
Calcium: 9 mg/dL (ref 8.7–10.2)
Chloride: 101 mmol/L (ref 96–106)
Creatinine, Ser: 1.23 mg/dL (ref 0.76–1.27)
GFR calc non Af Amer: 69 mL/min/{1.73_m2} (ref >59)
GFR, EST AFRICAN AMERICAN: 79 mL/min/{1.73_m2} (ref >59)
GLOBULIN, TOTAL: 2.2 g/dL (ref 1.5–4.5)
Glucose: 99 mg/dL (ref 65–99)
Potassium: 4.1 mmol/L (ref 3.5–5.2)
SODIUM: 138 mmol/L (ref 134–144)
Total Protein: 6.2 g/dL (ref 6.0–8.5)

## 2018-08-14 NOTE — Patient Instructions (Signed)
Medication Instructions:  Your physician recommends that you continue on your current medications as directed. Please refer to the Current Medication list given to you today.   Labwork: Lab work to be done today--CMET and Lipids  Testing/Procedures: none  Follow-Up: Your physician recommends that you schedule a follow-up appointment in: 12 months. Please call our office in about 8 months to schedule this appointment    Any Other Special Instructions Will Be Listed Below (If Applicable).     If you need a refill on your cardiac medications before your next appointment, please call your pharmacy.

## 2018-12-13 ENCOUNTER — Other Ambulatory Visit: Payer: Self-pay | Admitting: Cardiovascular Disease

## 2018-12-13 MED ORDER — METOPROLOL SUCCINATE ER 25 MG PO TB24: 25.0000 mg | ORAL_TABLET | Freq: Every day | ORAL | 2 refills | Status: DC

## 2018-12-13 MED ORDER — ISOSORBIDE MONONITRATE ER 30 MG PO TB24: 30.0000 mg | ORAL_TABLET | Freq: Every day | ORAL | 2 refills | Status: DC

## 2018-12-13 MED ORDER — ATORVASTATIN CALCIUM 40 MG PO TABS: ORAL_TABLET | ORAL | 2 refills | Status: DC

## 2018-12-13 MED ORDER — NITROGLYCERIN 0.4 MG SL SUBL: SUBLINGUAL_TABLET | SUBLINGUAL | 2 refills | Status: DC

## 2018-12-13 MED ORDER — LOSARTAN POTASSIUM 25 MG PO TABS: 25.0000 mg | ORAL_TABLET | Freq: Every day | ORAL | 2 refills | Status: DC

## 2018-12-13 NOTE — Telephone Encounter (Signed)
Pt's medications was sent to pt's pharmacy as requested. Confirmation received.  

## 2019-06-29 IMAGING — CR DG CHEST 1V PORT
1 series · 1 of 1 positions shown · non-contrast
Comparison: 04/28/2016

CLINICAL DATA: Cough for a long time.

EXAM:
PORTABLE CHEST 1 VIEW

[portable]
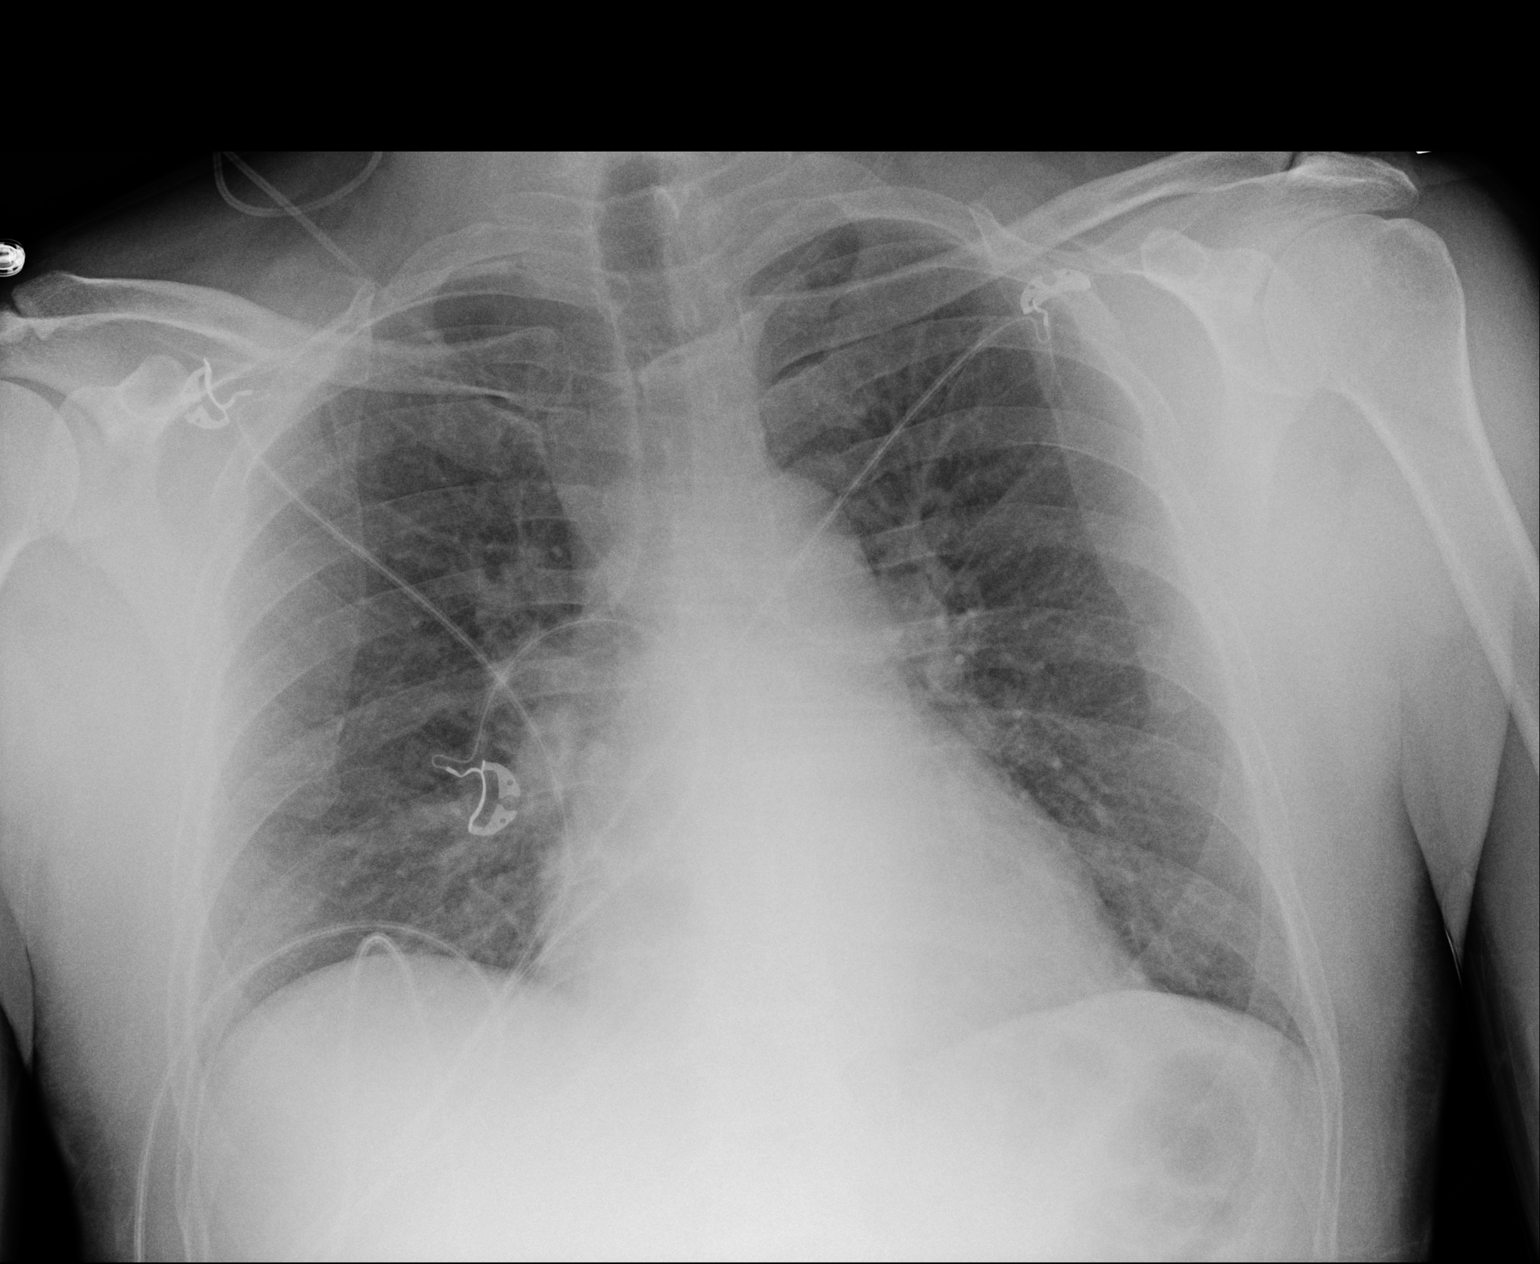

[1 of 1 positions shown; findings below may reference images not displayed]

FINDINGS: There are mild bilateral chronic bronchitic changes. There is no
focal parenchymal opacity. There is no pleural effusion or
pneumothorax. The heart and mediastinal contours are unremarkable.

The osseous structures are unremarkable.
IMPRESSION: No active disease.

## 2019-08-01 ENCOUNTER — Telehealth: Payer: Self-pay | Admitting: Cardiovascular Disease

## 2019-08-01 MED ORDER — NITROGLYCERIN 0.4 MG SL SUBL: SUBLINGUAL_TABLET | SUBLINGUAL | 2 refills | Status: AC

## 2019-08-01 MED ORDER — LOSARTAN POTASSIUM 25 MG PO TABS: 25.0000 mg | ORAL_TABLET | Freq: Every day | ORAL | 0 refills | Status: AC

## 2019-08-01 MED ORDER — METOPROLOL SUCCINATE ER 25 MG PO TB24: 25.0000 mg | ORAL_TABLET | Freq: Every day | ORAL | 0 refills | Status: AC

## 2019-08-01 MED ORDER — ISOSORBIDE MONONITRATE ER 30 MG PO TB24: 30.0000 mg | ORAL_TABLET | Freq: Every day | ORAL | 0 refills | Status: AC

## 2019-08-01 MED ORDER — ATORVASTATIN CALCIUM 40 MG PO TABS: ORAL_TABLET | ORAL | 0 refills | Status: AC

## 2019-08-01 NOTE — Telephone Encounter (Signed)
I do not know any cardiologist in [place]. He can start by getting a primary care doctor and asking them who they refer to in that area. Caleb Grant

## 2019-08-01 NOTE — Telephone Encounter (Signed)
Will forward to Dr. Angelena Form to see if he knows any cardiologists in that area.

## 2019-08-01 NOTE — Telephone Encounter (Signed)
New message   Patient would like Dr. Angelena Form to refer him to a cardiologist in [place], [place]. Please call.

## 2019-08-01 NOTE — Telephone Encounter (Signed)
I spoke with pt and gave him information from [facility].  He is concerned about his medications.  He has supply through October.  Will send 90 day supply to [facility] on [address] in [place] to carry him through until he establishes with new cardiologist.

## 2019-08-29 ENCOUNTER — Ambulatory Visit: Payer: Self-pay | Admitting: Cardiovascular Disease

## 2019-12-06 NOTE — Progress Notes (Deleted)
Cardiology Office Note   Date:  12/06/2019   ID:  Caleb Feil Sr., DOB 1971-03-16, MRN [id number]  PCP:  Marva Panda, NP  Cardiologist:  Dr. Clifton Drayton     No chief complaint on file.     History of Present Illness: Caleb HOLCOMB Sr. is a 49 y.o. male who presents for ***  history of CAD and tobacco abuse here today for follow up. He was admitted to [facility] June 2017 with a NSTEMI. Cardiac cath June 2017 with occluded second obtuse marginal branch treated with a DES x 1. Also with 20% mid LAD stenosis, 40% distal LAD stenosis, 20% proximal RCA stenosis. Echo June 2017 with LVEF=50-55%, posterolateral wall hypokinesis, mild MR. He did have hemorrhoidal bleeding and Brilinta was stopped. He was maintained on ASA. He was admitted with a suicide attempt in July 2018 by overdosing on oxycodone while intoxicated. He continue to smoke. He was seen in our office by Jacolyn Reedy, PA in June 2019 and c/o chest pain. Nuclear stress test August 10, 2018 with scar but no ischemia. Pt contacted with results and stated he was not taking his Imdur or Toprol due to cost. He continues to smoke 1 ppd.  Stress done for chest pain but due to inability to afford meds.  Changed to [employer] for $4 and did better  Doing well last visit.  08/14/18   Seen in ER 10/24/19 with chest pain. EKG SR with PVCs borderline ECG felt to be noncardiac chest pain. Troponin neg  BNP 54, electrolytes normal. hgb 17.3 plts 186  Today     Past Medical History:  Diagnosis Date  . Back pain   . CAD (coronary artery disease)    a. 04/2016: NSTEMI and NSVT: Cath showing 100% stenosis 2nd Mrg (PCI w/ DES), 20% stenosis mid-LAD, 40% distal LAD, and 20% proxRCA   . Essential hypertension   . PVC's (premature ventricular contractions)   . Suicide attempt (HCC) 05/2017  . Tobacco abuse   . Ventricular bigeminy    a. noted during 04/2016 admission. Started on BB.    Past Surgical History:  Procedure Laterality Date  .  CARDIAC CATHETERIZATION N/A 04/28/2016   Procedure: Left Heart Cath and Coronary Angiography;  Surgeon: Kathleene Hazel, MD;  Location: [facility] INVASIVE CV LAB;  Service: Cardiovascular;  Laterality: N/A;  . CARDIAC CATHETERIZATION N/A 04/28/2016   Procedure: Coronary Stent Intervention;  Surgeon: Kathleene Hazel, MD;  Location: [facility] INVASIVE CV LAB;  Service: Cardiovascular;  Laterality: N/A;     Current Outpatient Medications  Medication Sig Dispense Refill  . aspirin EC 81 MG EC tablet Take 1 tablet (81 mg total) by mouth daily.    Marland Kitchen atorvastatin (LIPITOR) 40 MG tablet TAKE 1 TABLET BY MOUTH EVERY DAY 90 tablet 0  . carisoprodol (SOMA) 350 MG tablet Take 350 mg by mouth 2 (two) times daily. Take 350 mg by mouth twice daily. (700mg ) total daily.    . Cyanocobalamin (VITAMIN B-12 PO) Take 100 mg by mouth daily.     . folic acid (FOLVITE) 1 MG tablet Take 1 mg by mouth daily.    gabapentin (NEURONTIN) 600 MG tablet Take 600 mg by mouth 3 (three) times daily.    . isosorbide mononitrate (IMDUR) 30 MG 24 hr tablet Take 1 tablet (30 mg total) by mouth daily. 90 tablet 0  . losartan (COZAAR) 25 MG tablet Take 1 tablet (25 mg total) by mouth daily. 90 tablet 0  .  metoprolol succinate (TOPROL XL) 25 MG 24 hr tablet Take 1 tablet (25 mg total) by mouth daily. 90 tablet 0  . nitroGLYCERIN (NITROSTAT) 0.4 MG SL tablet PLACE 1 TABLET UNDER THE TONGUE EVERY 5 MINUTES X3 DOSES AS NEEDED FOR CHEST PAIN 25 tablet 2  . Oxycodone HCl 10 MG TABS Take 10 mg by mouth every 6 (six) hours as needed (pain).     No current facility-administered medications for this visit.    Allergies:   Patient has no known allergies.    Social History:  The patient  reports that he has been smoking cigarettes. He has been smoking about 2.00 packs per day. He has never used smokeless tobacco. He reports that he does not drink alcohol or use drugs.   Family History:  The patient's ***family history includes Coronary  artery disease in his father and paternal grandfather; Heart disease in his sister; Heart failure in his sister.    ROS:  General:no colds or fevers, no weight changes Skin:no rashes or ulcers HEENT:no blurred vision, no congestion CV:see HPI PUL:see HPI GI:no diarrhea constipation or melena, no indigestion GU:no hematuria, no dysuria MS:no joint pain, no claudication Neuro:no syncope, no lightheadedness Endo:no diabetes, no thyroid disease Wt Readings from Last 3 Encounters:  08/14/18 173 lb 12.8 oz (78.8 kg)  08/10/18 174 lb (78.9 kg)  05/03/18 174 lb 6.4 oz (79.1 kg)     PHYSICAL EXAM: VS:  There were no vitals taken for this visit. , BMI There is no height or weight on file to calculate BMI. General:Pleasant affect, NAD Skin:Warm and dry, brisk capillary refill HEENT:normocephalic, sclera clear, mucus membranes moist Neck:supple, no JVD, no bruits  Heart:S1S2 RRR without murmur, gallup, rub or click Lungs:clear without rales, rhonchi, or wheezes WUJ:WJXB, non tender, + BS, do not palpate liver spleen or masses Ext:no lower ext edema, 2+ pedal pulses, 2+ radial pulses Neuro:alert and oriented, MAE, follows commands, + facial symmetry    EKG:  EKG is ordered today. The ekg ordered today demonstrates ***   Recent Labs: No results found for requested labs within last 8760 hours.    Lipid Panel    Component Value Date/Time   CHOL 84 (L) 08/14/2018 1113   TRIG 107 08/14/2018 1113   HDL 27 (L) 08/14/2018 1113   CHOLHDL 3.1 08/14/2018 1113   CHOLHDL 5.6 04/29/2016 0232   VLDL 26 04/29/2016 0232   LDLCALC 36 08/14/2018 1113       Other studies Reviewed: Additional studies/ records that were reviewed today include: ***. 04/28/16 cath  Prox RCA lesion, 20% stenosed.  Prox LAD to Mid LAD lesion, 20% stenosed.  Mid LAD to Dist LAD lesion, 40% stenosed.  2nd Mrg lesion, 100% stenosed. Post intervention, there is a 0% residual stenosis. The lesion was not  previously treated.  The left ventricular systolic function is normal.   1. Acute MI/NSTEMI secondary to occluded large first obtuse marginal branch. (No ST elevation on EKG).  2. Successful PTCA/DES x 1 obtuse marginal branch.  3. Mild non-obstructive disease in the LAD and RCA 4. Overall preserved LV systolic function  Recommendations: Will continue ASA and Brilinta for one year. Will start statin and beta blocker. Continue amiodarone drip tonight.   Stress test 08/2018  There was no ST segment deviation noted during stress.  Defect 1: There is a medium defect of moderate severity present in the basal inferolateral, basal anterolateral, mid inferolateral and mid anterolateral location.  Findings consistent with prior lateral myocardial  infarction.  The study was not gated so left ventricular function data is not available.  This is an intermediate risk study. There is evidence of a previous lateral myocardial infarction. There is no evidence of ischemia.   Echo 04/29/16 Study Conclusions  - Left ventricle: Posterior lateral wall hypokinesis The cavity   size was normal. Wall thickness was increased in a pattern of   mild LVH. Systolic function was normal. The estimated ejection   fraction was in the range of 50% to 55%. Left ventricular   diastolic function parameters were normal. - Mitral valve: There was mild regurgitation. - Atrial septum: No defect or patent foramen ovale was identified.   ASSESSMENT AND PLAN:  1.  ***   Current medicines are reviewed with the patient today.  The patient Has no concerns regarding medicines.  The following changes have been made:  See above Labs/ tests ordered today include:see above  Disposition:   FU:  see above  Signed, Nada Boozer, NP  12/06/2019 10:19 PM    [facility] [address] [place], [place], [place]  [address]/ [address] [place], [place] Phone: [phone]; Fax: [phone]  [phone]  [phone]

## 2019-12-07 ENCOUNTER — Ambulatory Visit: Payer: Self-pay | Admitting: Cardiology
